# Patient Record
Sex: Male | Born: 1950 | Race: White | Hispanic: No | Marital: Married | Smoking: Former smoker
Health system: Southern US, Community
[De-identification: ages and names within clinical notes are randomized; demographics above are authoritative.]

## PROBLEM LIST (undated history)

## (undated) DIAGNOSIS — I10 Essential (primary) hypertension: Secondary | ICD-10-CM

## (undated) HISTORY — PX: TOE SURGERY: SHX1073

## (undated) HISTORY — PX: TONSILLECTOMY: SUR1361

---

## 2010-08-23 ENCOUNTER — Inpatient Hospital Stay (HOSPITAL_COMMUNITY)
Admission: EM | Admit: 2010-08-23 | Discharge: 2010-08-24 | Payer: Self-pay | Source: Home / Self Care | Attending: Orthopedic Surgery | Admitting: Orthopedic Surgery

## 2010-09-19 NOTE — H&P (Signed)
  [name], [name]                ACCOUNT NO.:  [id number]  MEDICAL RECORD NO.:  [id number]          PATIENT TYPE:  EMS  LOCATION:  MAJO                         FACILITY:  [facility]  PHYSICIAN:  Leonides Grills, M.D.     DATE OF BIRTH:  November 27, 1950  DATE OF ADMISSION:  08/23/2010 DATE OF DISCHARGE:                             HISTORY & PHYSICAL   CHIEF COMPLAINT:  Left third toe injury.  HISTORY OF PRESENT ILLNESS:  [name] is a 60 year old male playing soccer with a dog and kicked a door jam at approximately 6:15 p.m. tonight.  The patient injured his third toe only.  Denies any other injury.  Last meal 11:00 a.m. today.  ALLERGIES:  No known drug allergies.  MEDICATIONS:  Aspirin.  PAST MEDICAL HISTORY:  Healthy.  PAST SURGICAL HISTORY:  Tonsillectomy.  SOCIAL HISTORY:  Drinks 2-6 beers per day.  No drugs, nonsmoker, and married.  REVIEW OF SYSTEMS:  Negative for diabetes mellitus, heart disease. Negative for history of blood clots.  Negative for sleep apnea. Negative for gastric disease or respiratory disease.  PHYSICAL EXAMINATION:  VITAL SIGNS:  Temperature is 98.2, blood pressure 176/86, pulse 105, respiratory rates 20, and O2 saturations 96% on room air. GENERAL:  The patient is a well-developed, well-nourished male in no acute distress. CARDIAC:  Regular rate and rhythm.  No murmurs, rubs, or gallops noted. CHEST:  Lungs clear to auscultation bilaterally.  No wheezing, rhonchi, or rales. ABDOMEN:  Soft and nontender.  Bowel sounds x4 quadrants. NEURO:  Alert and oriented x3.  Sensation intact, L4-S1.  Wiggles toes on command. SKIN:  Open fracture, left third dorsal aspect of the PIP joint.  No rash of his lower extremities. VASCULAR:  Dorsal and pedal pulse 2+ bilaterally. LOWER EXTREMITIES:  Left third toe with open fracture, PIP joint. Otherwise, no gross deformities bilateral feet.  Pain in left foot nontender throughout.  No active bleeding of the left third  toe.  I anticipate that the foot is warm and dry.  RADIOGRAPHS:  Left foot fracture, proximal aspect of the middle phalanx, probable fracture distal aspect of the proximal phalanx, distal interphalangeal joint subluxation.  LABORATORY DATA:  Pending.  ASSESSMENT AND PLAN: 1. The patient is a healthy 60 year old male with open fracture, left     third toe proximal interphalangeal joint. 2. Incision and drainage of left third toe with open reduction and     internal fixation of fracture and possible proximal phalanx head     resection. 3. See Dr. Lestine Box at 5000. 4. Ancef 1 gram IV preop. 5. Alcohol withdrawal protocol.     Richardean Canal, P.A.   ______________________________ Leonides Grills, M.D.    GC/MEDQ  D:  08/23/2010  T:  08/25/2010  Job:  [id number]  Electronically Signed by Richardean Canal P.A. on 09/09/2010 09:03:15 AM Electronically Signed by Leonides Grills M.D. on 09/19/2010 03:43:18 PM

## 2010-11-25 NOTE — Discharge Summary (Signed)
  [name], [name]                ACCOUNT NO.:  [id number]  MEDICAL RECORD NO.:  [id number]          PATIENT TYPE:  INP  LOCATION:  5017                         FACILITY:  [facility]  PHYSICIAN:  Leonides Grills, M.D.     DATE OF BIRTH:  1950/12/02  DATE OF ADMISSION:  08/23/2010 DATE OF DISCHARGE:  08/24/2010                        DISCHARGE SUMMARY - REFERRING   ADMISSION DIAGNOSIS:  Open fracture of left third toe, proximal interphalangeal joint.  DISCHARGE DIAGNOSES: 1. Status post left third toe incision and drainage to bone, open     fracture dislocation. 2. Primary repair of left third toe extensor digitorum longus tendon. 3. Open reduction and internal fixation, left third toe, middle     phalanx, open fracture. 4. Left third toe proximal phalanx head resection.  HISTORY OF PRESENT ILLNESS:  [name], 60 year old male, playing soccer with his dog and accidentally kicked a door jamb at approximately 6:15 on August 23, 2010.  The patient injured his third toe only.  He denies any other injuries.  Last meal was at 11 a.m. today.  The patient seen in the [facility] ER.  CONSULTS:  None.  SURGICAL PROCEDURES:  The patient was taken to the operating room on August 24, 2010, by Dr. Leonides Grills assisted by Richardean Canal, PA-C. The patient underwent general anesthesia and then following procedure was performed. 1. Irrigation and debridement to bone/joint open third toe fracture     dislocation. 2. Primary repair of left third toe extensor digitorum longus. 3. Open reduction and internal fixation of left third toe middle     phalanx open fracture. 4. Left third toe proximal phalanx head resection.  The patient     tolerated procedure well and returned to recovery in good stable     condition.  HOSPITAL COURSE:  The patient seen at 6 a.m. postoperative.  The patient was afebrile and vital signs were stable at that time.  The patient was to work with physical therapy and was  to be discharged home after he received his final IV antibiotics and as he was comfortable and had performed well with therapy.  DISCHARGE INSTRUCTIONS:  ACTIVITY: 1. Heel weightbearing left lower extremity in hard-soled shoes. 2. Wound care.  Keep dressing clean, dry and intact.  SPECIAL INSTRUCTIONS:  Elevate left leg above heart.  FOLLOWUP:  Follow up with Dr. Lestine Box in 1 week.  Call [phone] for appointment.  MEDICATIONS: 1. Percocet 5/325 one to two tablets q.4-6 h. p.r.n. pain. 2. Robaxin 500 mg one tablet q.8 h. p.r.n. spasm.  CONDITION ON DISCHARGE:  The patient was discharged home in good stable condition.     Richardean Canal, P.A.   ______________________________ Leonides Grills, M.D.    GC/MEDQ  D:  09/23/2010  T:  09/23/2010  Job:  [id number]  cc:   Leonides Grills, M.D.  Electronically Signed by Richardean Canal P.A. on 09/24/2010 03:14:15 PM Electronically Signed by Leonides Grills M.D. on 10/04/2010 91:47:82 AM

## 2012-08-24 HISTORY — PX: BACK SURGERY: SHX140

## 2013-04-28 ENCOUNTER — Other Ambulatory Visit: Payer: Self-pay | Admitting: Family Medicine

## 2013-04-28 DIAGNOSIS — M5416 Radiculopathy, lumbar region: Secondary | ICD-10-CM

## 2013-05-09 ENCOUNTER — Ambulatory Visit
Admission: RE | Admit: 2013-05-09 | Discharge: 2013-05-09 | Disposition: A | Discharge status: Home / Self Care | Payer: PRIVATE HEALTH INSURANCE | Source: Ambulatory Visit | Attending: Family Medicine | Admitting: Family Medicine

## 2013-05-09 DIAGNOSIS — M5416 Radiculopathy, lumbar region: Secondary | ICD-10-CM

## 2013-05-22 ENCOUNTER — Other Ambulatory Visit: Payer: Self-pay | Admitting: Neurosurgery

## 2013-05-23 ENCOUNTER — Encounter (HOSPITAL_COMMUNITY)
Admission: RE | Admit: 2013-05-23 | Discharge: 2013-05-23 | Disposition: A | Discharge status: Home / Self Care | Payer: PRIVATE HEALTH INSURANCE | Source: Ambulatory Visit | Attending: Neurosurgery | Admitting: Neurosurgery

## 2013-05-23 ENCOUNTER — Encounter (HOSPITAL_COMMUNITY)
Admission: RE | Admit: 2013-05-23 | Discharge: 2013-05-23 | Disposition: A | Discharge status: Home / Self Care | Payer: PRIVATE HEALTH INSURANCE | Source: Ambulatory Visit | Attending: Anesthesiology | Admitting: Anesthesiology

## 2013-05-23 ENCOUNTER — Encounter (HOSPITAL_COMMUNITY): Payer: Self-pay

## 2013-05-23 HISTORY — DX: Essential (primary) hypertension: I10

## 2013-05-23 LAB — COMPREHENSIVE METABOLIC PANEL
BUN: 7 mg/dL (ref 6–23)
CO2: 25 mEq/L (ref 19–32)
Chloride: 98 mEq/L (ref 96–112)
Creatinine, Ser: 0.68 mg/dL (ref 0.50–1.35)
GFR calc Af Amer: >90 mL/min (ref >90)
GFR calc non Af Amer: >90 mL/min (ref >90)
Total Bilirubin: 0.2 mg/dL — ABNORMAL LOW (ref 0.3–1.2)

## 2013-05-23 LAB — CBC
HCT: 44.5 % (ref 39.0–52.0)
Hemoglobin: 16.2 g/dL (ref 13.0–17.0)
MCH: 34.5 pg — ABNORMAL HIGH (ref 26.0–34.0)
MCHC: 36.4 g/dL — ABNORMAL HIGH (ref 30.0–36.0)
RDW: 12.4 % (ref 11.5–15.5)

## 2013-05-23 LAB — TYPE AND SCREEN
ABO/RH(D): B POS
Antibody Screen: NEGATIVE

## 2013-05-23 LAB — ABO/RH: ABO/RH(D): B POS

## 2013-05-23 MED ORDER — CEFAZOLIN SODIUM-DEXTROSE 2-3 GM-% IV SOLR
2.0000 g | INTRAVENOUS | Status: AC
  Administered 2013-05-24: 2 g via INTRAVENOUS
  Filled 2013-05-23: qty 50

## 2013-05-23 NOTE — Pre-Procedure Instructions (Signed)
RYNELL CIOTTI  05/23/2013   Your procedure is scheduled on: Wednesday, May 24, 2013  Report to [facility] Short Stay (use Main Entrance "A'') at 11:00 AM.  Call this number if you have problems the morning of surgery: [phone]   Remember:   Do not eat food or drink liquids after midnight.   Take these medicines the morning of surgery with A SIP OF WATER: If needed: traMADol (ULTRAM) 50 MG tablet  for pain  Stop taking Aspirin and herbal medications. Do not take any NSAIDs ie: Ibuprofen, Advil, Naproxen or any medication  containing Aspirin.   Do not wear jewelry, make-up or nail polish.  Do not wear lotions, powders, or perfumes. You may wear deodorant.  Do not shave 48 hours prior to surgery. Men may shave face and neck.  Do not bring valuables to the hospital.  [facility] is not responsible for any belongings or valuables.               Contacts, dentures or bridgework may not be worn into surgery.  Leave suitcase in the car. After surgery it may be brought to your room.  For patients admitted to the hospital, discharge time is determined by your treatment team.               Patients discharged the day of surgery will not be allowed to drive home.  Name and phone number of your driver:   Special Instructions: Shower using CHG 2 nights before surgery and the night before surgery.  If you shower the day of surgery use CHG.  Use special wash - you have one bottle of CHG for all showers.  You should use approximately 1/3 of the bottle for each shower.   Please read over the following fact sheets that you were given: Pain Booklet, Coughing and Deep Breathing, Blood Transfusion Information, MRSA Information and Surgical Site Infection Prevention

## 2013-05-24 ENCOUNTER — Inpatient Hospital Stay (HOSPITAL_COMMUNITY): Payer: PRIVATE HEALTH INSURANCE | Admitting: *Deleted

## 2013-05-24 ENCOUNTER — Inpatient Hospital Stay (HOSPITAL_COMMUNITY)
Admission: RE | Admit: 2013-05-24 | Discharge: 2013-05-27 | DRG: 460 | Disposition: A | Discharge status: Home Health | Payer: PRIVATE HEALTH INSURANCE | Source: Ambulatory Visit | Attending: Neurosurgery | Admitting: Neurosurgery

## 2013-05-24 ENCOUNTER — Encounter (HOSPITAL_COMMUNITY)
Admission: RE | Disposition: A | Discharge status: Home Health | Payer: Self-pay | Source: Ambulatory Visit | Attending: Neurosurgery

## 2013-05-24 ENCOUNTER — Inpatient Hospital Stay (HOSPITAL_COMMUNITY): Payer: PRIVATE HEALTH INSURANCE

## 2013-05-24 ENCOUNTER — Encounter (HOSPITAL_COMMUNITY): Payer: Self-pay | Admitting: *Deleted

## 2013-05-24 DIAGNOSIS — M48062 Spinal stenosis, lumbar region with neurogenic claudication: Secondary | ICD-10-CM | POA: Diagnosis present

## 2013-05-24 DIAGNOSIS — Z01818 Encounter for other preprocedural examination: Secondary | ICD-10-CM

## 2013-05-24 DIAGNOSIS — Z01812 Encounter for preprocedural laboratory examination: Secondary | ICD-10-CM

## 2013-05-24 DIAGNOSIS — I10 Essential (primary) hypertension: Secondary | ICD-10-CM | POA: Diagnosis present

## 2013-05-24 DIAGNOSIS — Q762 Congenital spondylolisthesis: Principal | ICD-10-CM

## 2013-05-24 DIAGNOSIS — Z01810 Encounter for preprocedural cardiovascular examination: Secondary | ICD-10-CM

## 2013-05-24 DIAGNOSIS — M4316 Spondylolisthesis, lumbar region: Secondary | ICD-10-CM | POA: Diagnosis present

## 2013-05-24 SURGERY — POSTERIOR LUMBAR FUSION 1 LEVEL
Anesthesia: General | Site: Back | Wound class: Clean

## 2013-05-24 MED ORDER — POTASSIUM CHLORIDE IN NACL 20-0.9 MEQ/L-% IV SOLN
INTRAVENOUS | Status: DC
  Administered 2013-05-24 – 2013-05-25 (×2): via INTRAVENOUS
  Filled 2013-05-24 (×4): qty 1000

## 2013-05-24 MED ORDER — PHENOL 1.4 % MT LIQD
1.0000 | OROMUCOSAL | Status: DC | PRN
  Administered 2013-05-24: 1 via OROMUCOSAL
  Filled 2013-05-24: qty 177

## 2013-05-24 MED ORDER — LIDOCAINE HCL (CARDIAC) 20 MG/ML IV SOLN
INTRAVENOUS | Status: DC | PRN
  Administered 2013-05-24: 70 mg via INTRAVENOUS

## 2013-05-24 MED ORDER — POLYETHYLENE GLYCOL 3350 17 G PO PACK
17.0000 g | PACK | Freq: Every day | ORAL | Status: DC | PRN
  Filled 2013-05-24: qty 1

## 2013-05-24 MED ORDER — HYDROMORPHONE HCL PF 1 MG/ML IJ SOLN
0.5000 mg | INTRAMUSCULAR | Status: DC | PRN
  Administered 2013-05-25 – 2013-05-27 (×5): 1 mg via INTRAVENOUS
  Filled 2013-05-24 (×6): qty 1

## 2013-05-24 MED ORDER — LACTATED RINGERS IV SOLN
INTRAVENOUS | Status: DC
  Administered 2013-05-24: 11:00:00 via INTRAVENOUS

## 2013-05-24 MED ORDER — MENTHOL 3 MG MT LOZG
1.0000 | LOZENGE | OROMUCOSAL | Status: DC | PRN
  Filled 2013-05-24: qty 9

## 2013-05-24 MED ORDER — HYDROMORPHONE HCL PF 1 MG/ML IJ SOLN: 0.2500 mg | INTRAMUSCULAR | Status: DC | PRN

## 2013-05-24 MED ORDER — ONDANSETRON HCL 4 MG/2ML IJ SOLN: 4.0000 mg | Freq: Four times a day (QID) | INTRAMUSCULAR | Status: DC | PRN

## 2013-05-24 MED ORDER — SUFENTANIL CITRATE 50 MCG/ML IV SOLN
INTRAVENOUS | Status: DC | PRN
  Administered 2013-05-24 (×5): 10 ug via INTRAVENOUS

## 2013-05-24 MED ORDER — OXYCODONE HCL 5 MG/5ML PO SOLN: 5.0000 mg | Freq: Once | ORAL | Status: DC | PRN

## 2013-05-24 MED ORDER — PROPOFOL 10 MG/ML IV BOLUS
INTRAVENOUS | Status: DC | PRN
  Administered 2013-05-24: 200 mg via INTRAVENOUS

## 2013-05-24 MED ORDER — SODIUM CHLORIDE 0.9 % IJ SOLN: 3.0000 mL | INTRAMUSCULAR | Status: DC | PRN

## 2013-05-24 MED ORDER — ONDANSETRON HCL 4 MG/2ML IJ SOLN
INTRAMUSCULAR | Status: DC | PRN
  Administered 2013-05-24: 4 mg via INTRAVENOUS

## 2013-05-24 MED ORDER — CEFAZOLIN SODIUM 1-5 GM-% IV SOLN
1.0000 g | Freq: Three times a day (TID) | INTRAVENOUS | Status: AC
  Administered 2013-05-24 – 2013-05-25 (×2): 1 g via INTRAVENOUS
  Filled 2013-05-24 (×2): qty 50

## 2013-05-24 MED ORDER — LISINOPRIL 20 MG PO TABS
20.0000 mg | ORAL_TABLET | Freq: Every day | ORAL | Status: DC
  Administered 2013-05-24 – 2013-05-27 (×4): 20 mg via ORAL
  Filled 2013-05-24 (×4): qty 1

## 2013-05-24 MED ORDER — NEOSTIGMINE METHYLSULFATE 1 MG/ML IJ SOLN
INTRAMUSCULAR | Status: DC | PRN
  Administered 2013-05-24: 5 mg via INTRAVENOUS

## 2013-05-24 MED ORDER — ROCURONIUM BROMIDE 100 MG/10ML IV SOLN
INTRAVENOUS | Status: DC | PRN
  Administered 2013-05-24: 50 mg via INTRAVENOUS

## 2013-05-24 MED ORDER — ASPIRIN EC 81 MG PO TBEC
81.0000 mg | DELAYED_RELEASE_TABLET | Freq: Every day | ORAL | Status: DC
  Administered 2013-05-24 – 2013-05-27 (×4): 81 mg via ORAL
  Filled 2013-05-24 (×5): qty 1

## 2013-05-24 MED ORDER — ALUM & MAG HYDROXIDE-SIMETH 200-200-20 MG/5ML PO SUSP: 30.0000 mL | Freq: Four times a day (QID) | ORAL | Status: DC | PRN

## 2013-05-24 MED ORDER — GLYCOPYRROLATE 0.2 MG/ML IJ SOLN
INTRAMUSCULAR | Status: DC | PRN
  Administered 2013-05-24: 0.6 mg via INTRAVENOUS

## 2013-05-24 MED ORDER — LIDOCAINE-EPINEPHRINE 0.5 %-1:200000 IJ SOLN
INTRAMUSCULAR | Status: DC | PRN
  Administered 2013-05-24: 10 mL

## 2013-05-24 MED ORDER — 0.9 % SODIUM CHLORIDE (POUR BTL) OPTIME
TOPICAL | Status: DC | PRN
  Administered 2013-05-24: 1000 mL

## 2013-05-24 MED ORDER — DIAZEPAM 5 MG PO TABS
5.0000 mg | ORAL_TABLET | Freq: Four times a day (QID) | ORAL | Status: DC | PRN
  Administered 2013-05-24 – 2013-05-25 (×3): 5 mg via ORAL
  Filled 2013-05-24 (×3): qty 1

## 2013-05-24 MED ORDER — SODIUM CHLORIDE 0.9 % IJ SOLN
3.0000 mL | Freq: Two times a day (BID) | INTRAMUSCULAR | Status: DC
  Administered 2013-05-24 – 2013-05-27 (×5): 3 mL via INTRAVENOUS

## 2013-05-24 MED ORDER — HYDROCODONE-ACETAMINOPHEN 5-325 MG PO TABS
1.0000 | ORAL_TABLET | ORAL | Status: DC | PRN
  Administered 2013-05-26: 2 via ORAL
  Filled 2013-05-24: qty 2

## 2013-05-24 MED ORDER — OXYCODONE-ACETAMINOPHEN 5-325 MG PO TABS
1.0000 | ORAL_TABLET | ORAL | Status: DC | PRN
  Administered 2013-05-25 – 2013-05-27 (×6): 2 via ORAL
  Filled 2013-05-24 (×8): qty 2

## 2013-05-24 MED ORDER — PHENYLEPHRINE HCL 10 MG/ML IJ SOLN
INTRAMUSCULAR | Status: DC | PRN
  Administered 2013-05-24 (×3): 80 ug via INTRAVENOUS

## 2013-05-24 MED ORDER — ACETAMINOPHEN 325 MG PO TABS: 650.0000 mg | ORAL_TABLET | ORAL | Status: DC | PRN

## 2013-05-24 MED ORDER — MIDAZOLAM HCL 5 MG/5ML IJ SOLN
INTRAMUSCULAR | Status: DC | PRN
  Administered 2013-05-24: 2 mg via INTRAVENOUS

## 2013-05-24 MED ORDER — SENNA 8.6 MG PO TABS
1.0000 | ORAL_TABLET | Freq: Two times a day (BID) | ORAL | Status: DC
  Administered 2013-05-24 – 2013-05-27 (×6): 8.6 mg via ORAL
  Filled 2013-05-24 (×8): qty 1

## 2013-05-24 MED ORDER — OXYCODONE HCL 5 MG PO TABS: 5.0000 mg | ORAL_TABLET | Freq: Once | ORAL | Status: DC | PRN

## 2013-05-24 MED ORDER — ACETAMINOPHEN 650 MG RE SUPP: 650.0000 mg | RECTAL | Status: DC | PRN

## 2013-05-24 MED ORDER — LACTATED RINGERS IV SOLN
INTRAVENOUS | Status: DC | PRN
  Administered 2013-05-24 (×3): via INTRAVENOUS

## 2013-05-24 MED ORDER — ONDANSETRON HCL 4 MG/2ML IJ SOLN: 4.0000 mg | INTRAMUSCULAR | Status: DC | PRN

## 2013-05-24 MED ORDER — THROMBIN 20000 UNITS EX SOLR
CUTANEOUS | Status: DC | PRN
  Administered 2013-05-24: 17:00:00 via TOPICAL

## 2013-05-24 MED ORDER — SODIUM CHLORIDE 0.9 % IV SOLN: 250.0000 mL | INTRAVENOUS | Status: DC

## 2013-05-24 SURGICAL SUPPLY — 66 items
BENZOIN TINCTURE PRP APPL 2/3 (GAUZE/BANDAGES/DRESSINGS) IMPLANT
BLADE SURG ROTATE 9660 (MISCELLANEOUS) ×2 IMPLANT
BONE MATRIX OSTEOCEL PRO MED (Bone Implant) ×2 IMPLANT
BONE MATRIX OSTEOCEL PRO SM (Bone Implant) ×2 IMPLANT
BUR MATCHSTICK NEURO 3.0 LAGG (BURR) ×2 IMPLANT
CAGE COROENT MP 11X23X9 (Cage) ×4 IMPLANT
CANISTER SUCTION 2500CC (MISCELLANEOUS) ×2 IMPLANT
CLOTH BEACON ORANGE TIMEOUT ST (SAFETY) ×2 IMPLANT
CONT SPEC 4OZ CLIKSEAL STRL BL (MISCELLANEOUS) ×2 IMPLANT
COVER BACK TABLE 24X17X13 BIG (DRAPES) IMPLANT
DECANTER SPIKE VIAL GLASS SM (MISCELLANEOUS) ×2 IMPLANT
DERMABOND ADVANCED (GAUZE/BANDAGES/DRESSINGS) ×1
DERMABOND ADVANCED .7 DNX12 (GAUZE/BANDAGES/DRESSINGS) ×1 IMPLANT
DRAPE C-ARM 42X72 X-RAY (DRAPES) ×6 IMPLANT
DRAPE C-ARMOR (DRAPES) ×2 IMPLANT
DRAPE LAPAROTOMY 100X72X124 (DRAPES) ×2 IMPLANT
DRAPE POUCH INSTRU U-SHP 10X18 (DRAPES) ×2 IMPLANT
DRAPE SURG 17X23 STRL (DRAPES) ×2 IMPLANT
DRESSING TELFA 8X3 (GAUZE/BANDAGES/DRESSINGS) IMPLANT
DRSG OPSITE POSTOP 4X6 (GAUZE/BANDAGES/DRESSINGS) ×2 IMPLANT
DURAPREP 26ML APPLICATOR (WOUND CARE) ×2 IMPLANT
ELECT REM PT RETURN 9FT ADLT (ELECTROSURGICAL) ×2
ELECTRODE REM PT RTRN 9FT ADLT (ELECTROSURGICAL) ×1 IMPLANT
GAUZE SPONGE 4X4 16PLY XRAY LF (GAUZE/BANDAGES/DRESSINGS) IMPLANT
GLOVE BIO SURGEON STRL SZ 6.5 (GLOVE) ×10 IMPLANT
GLOVE BIOGEL PI IND STRL 6.5 (GLOVE) ×2 IMPLANT
GLOVE BIOGEL PI INDICATOR 6.5 (GLOVE) ×2
GLOVE ECLIPSE 6.5 STRL STRAW (GLOVE) ×4 IMPLANT
GLOVE ECLIPSE 7.5 STRL STRAW (GLOVE) ×4 IMPLANT
GLOVE EXAM NITRILE LRG STRL (GLOVE) IMPLANT
GLOVE EXAM NITRILE MD LF STRL (GLOVE) IMPLANT
GLOVE EXAM NITRILE XL STR (GLOVE) IMPLANT
GLOVE EXAM NITRILE XS STR PU (GLOVE) IMPLANT
GLOVE INDICATOR 7.5 STRL GRN (GLOVE) ×2 IMPLANT
GOWN BRE IMP SLV AUR LG STRL (GOWN DISPOSABLE) ×8 IMPLANT
GOWN BRE IMP SLV AUR XL STRL (GOWN DISPOSABLE) ×2 IMPLANT
GOWN STRL REIN 2XL LVL4 (GOWN DISPOSABLE) IMPLANT
KIT BASIN OR (CUSTOM PROCEDURE TRAY) ×2 IMPLANT
KIT POSITION SURG JACKSON T1 (MISCELLANEOUS) ×2 IMPLANT
KIT ROOM TURNOVER OR (KITS) ×2 IMPLANT
NEEDLE HYPO 25X1 1.5 SAFETY (NEEDLE) ×2 IMPLANT
NEEDLE SPNL 18GX3.5 QUINCKE PK (NEEDLE) ×2 IMPLANT
NS IRRIG 1000ML POUR BTL (IV SOLUTION) ×2 IMPLANT
PACK LAMINECTOMY NEURO (CUSTOM PROCEDURE TRAY) ×2 IMPLANT
PAD ARMBOARD 7.5X6 YLW CONV (MISCELLANEOUS) ×4 IMPLANT
ROD 35MM (Rod) ×4 IMPLANT
SCREW LOCK (Screw) ×4 IMPLANT
SCREW LOCK FXNS SPNE MAS PL (Screw) ×4 IMPLANT
SCREW SHANK 5.0X30MM (Screw) ×4 IMPLANT
SCREW SHANK PLIF 5.0X25 LUMBAR (Screw) ×4 IMPLANT
SCREW TULIP 5.5 (Screw) ×8 IMPLANT
SPONGE GAUZE 4X4 12PLY (GAUZE/BANDAGES/DRESSINGS) IMPLANT
SPONGE LAP 4X18 X RAY DECT (DISPOSABLE) IMPLANT
SPONGE SURGIFOAM ABS GEL 100 (HEMOSTASIS) ×2 IMPLANT
STRIP CLOSURE SKIN 1/2X4 (GAUZE/BANDAGES/DRESSINGS) IMPLANT
SUT PROLENE 6 0 BV (SUTURE) IMPLANT
SUT VIC AB 0 CT1 18XCR BRD8 (SUTURE) ×1 IMPLANT
SUT VIC AB 0 CT1 8-18 (SUTURE) ×1
SUT VIC AB 2-0 CT1 18 (SUTURE) ×4 IMPLANT
SUT VIC AB 3-0 SH 8-18 (SUTURE) ×4 IMPLANT
SYR 20ML ECCENTRIC (SYRINGE) ×2 IMPLANT
TOWEL OR 17X24 6PK STRL BLUE (TOWEL DISPOSABLE) ×2 IMPLANT
TOWEL OR 17X26 10 PK STRL BLUE (TOWEL DISPOSABLE) ×2 IMPLANT
TRAY FOLEY CATH 14FRSI W/METER (CATHETERS) IMPLANT
TRAY FOLEY CATH 16FRSI W/METER (SET/KITS/TRAYS/PACK) ×2 IMPLANT
WATER STERILE IRR 1000ML POUR (IV SOLUTION) ×2 IMPLANT

## 2013-05-24 NOTE — Anesthesia Procedure Notes (Signed)
Procedure Name: Intubation Date/Time: 05/24/2013 4:33 PM Performed by: [name] Pre-anesthesia Checklist: Patient identified, Emergency Drugs available, Suction available, Patient being monitored and Timeout performed Patient Re-evaluated:Patient Re-evaluated prior to inductionOxygen Delivery Method: Circle system utilized Preoxygenation: Pre-oxygenation with 100% oxygen Intubation Type: IV induction Ventilation: Mask ventilation without difficulty and Oral airway inserted - appropriate to patient size Laryngoscope Size: Miller and 2 Grade View: Grade I Tube type: Oral Tube size: 7.5 mm Number of attempts: 1 Airway Equipment and Method: Stylet Placement Confirmation: ETT inserted through vocal cords under direct vision,  positive ETCO2 and breath sounds checked- equal and bilateral Secured at: 22 cm Tube secured with: Tape Dental Injury: Teeth and Oropharynx as per pre-operative assessment

## 2013-05-24 NOTE — Anesthesia Postprocedure Evaluation (Signed)
  Anesthesia Post-op Note  Patient: [name]  Procedure(s) Performed: Procedure(s) with comments: Lumbar Four-Five Posterior lumbar interbody fusion with interbody prosthesis posterior lateral arthrodesis and posterior nonsegmental instrumentation (N/A) - Lumbar Four-Five posterior lumbar interbody fusion with interbody prosthesis posterior lateral arthrodesis and posterior nonsegmental instrumentation  Patient Location: PACU  Anesthesia Type:General  Level of Consciousness: awake  Airway and Oxygen Therapy: Patient Spontanous Breathing  Post-op Pain: mild  Post-op Assessment: Post-op Vital signs reviewed  Post-op Vital Signs: Reviewed  Complications: No apparent anesthesia complications 

## 2013-05-24 NOTE — Preoperative (Signed)
Beta Blockers   Reason not to administer Beta Blockers:Not Applicable 

## 2013-05-24 NOTE — Plan of Care (Signed)
Problem: Consults Goal: Diagnosis - Spinal Surgery Outcome: Progressing Thoraco/Lumbar Spine Fusion     

## 2013-05-24 NOTE — H&P (Signed)
BP 169/102  Pulse 98  Temp(Src) 98.1 F (36.7 C) (Oral)  Resp 20  SpO2 97% HISTORY OF PRESENT ILLNESS:                     [name] comes in today for evaluation of severe pain that he has in his lower extremities whenever he stands or walks.  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  Simply because it hurts too much.  He is 62 years of age, left-handed.   He is a retired Geophysicist/field seismologist for [name].   His pain chart, he lists pain posteriorly in his legs, not much in the way of back pain, nothing but sitting will help to relieve the pain.   REVIEW OF SYSTEMS:                                    Positive for hypertension, leg pain with walking, back pain, and leg pain.  He denies allergic, hematologic, endocrine, psychiatric, neurologic, skin, genitourinary, gastrointestinal, respiratory, ears, nose, throat, mouth, and eye or constitutional problems.   PAST MEDICAL HISTORY:            Current Medical Conditions:  Included hypertension.            Medications and Allergies:  No known drug allergies.  He takes lisinopril, Bayer aspirin 81 mg a day, tramadol, and ibuprofen p.r.n.   FAMILY HISTORY:                                            Positive for brain tumor and coronary artery disease, myocardial infarction.  He sustained a compound fracture of his toe in December 2013.   SOCIAL HISTORY:                                            He does smoke.  He does drink alcohol.  He does not use illicit drugs.   PHYSICAL EXAMINATION:                                On examination, he is alert and oriented x4 and answers all questions appropriately.  He is in obvious distress.  He can toe walk and heel walk.  Normal muscle tone, bulk, and coordination.  5/5 strength on manual exam in the lower extremities and slight weakness in his right grip.  Otherwise, normal  strength in the upper extremities.  2+ reflexes, biceps, triceps, brachioradialis, knees, and ankles.  Positive Tinel sign both carpal tunnels, left greater than right.  Romberg is negative.  He can toe walk.  He can heel walk.  Pupils equal, round and reactive to light.  Full extraocular movements.  Full visual fields.  Hearing intact to finger rub bilaterally.  Uvula elevates midline.  Shoulder shrug is normal.  Tongue protrudes in the midline.  Downgoing toes to plantar stimulation.  No clonus.  Mild Hoffmann's bilaterally.   IMAGING STUDIES:  MRI of the lumbar spine shows severe stenosis at L4-5 facet arthropathy.  Conus and cauda are normal.  He does have foraminal narrowing bilaterally at L5-S1 worse on sagittal than on the axial.  Cauda is normal.  Conus medullaris is normal.  No abnormalities within the vertebral bodies.   DIAGNOSIS:                                                     Spondylolisthesis approximately 3 mm of L4 on L5.  He has retrolisthesis of L5 on S1 about 3 mm.  The stenosis at L4-5 is clearly the problem in his case.  He has anatomic problems at L5-S1, but that is not causing the pain that he has now posterior in both legs whenever he stands or walks.  He has classic neurogenic claudication and I think this can be improved with lumbar decompression and subsequent arthrodesis.  I gave him the reasons as to why we would leave L5-S1 out and that being simply because it looks bad on MRI, does not mean he is going to be better because I operated and extended his fusion the whole another level.  I do think that he will have a great deal of pain afterwards, but will improve slowly but surely over the next few months.  His return of work will be guided by his functionality, but there is nothing inherent that puts his back and they need to take a risk.  We scheduled his operation for next week Wednesday.  I gave he and his wife the detailed instruction  sheet with regards to the operation.  I believe I answered all of their questions in the office today.  The risks, benefits, bleeding, infection, no relief, fusion failure, hardware failure, damage to the nerve roots, bowel or bladder dysfunction were discussed.  He and his family wishes to proceed.

## 2013-05-24 NOTE — Transfer of Care (Signed)
Immediate Anesthesia Transfer of Care Note  Patient: [name]  Procedure(s) Performed: Procedure(s) with comments: Lumbar Four-Five Posterior lumbar interbody fusion with interbody prosthesis posterior lateral arthrodesis and posterior nonsegmental instrumentation (N/A) - Lumbar Four-Five posterior lumbar interbody fusion with interbody prosthesis posterior lateral arthrodesis and posterior nonsegmental instrumentation  Patient Location: PACU  Anesthesia Type:General  Level of Consciousness: awake  Airway & Oxygen Therapy: Patient Spontanous Breathing and Patient connected to face mask oxygen  Post-op Assessment: Report given to PACU RN and Post -op Vital signs reviewed and stable  Post vital signs: Reviewed and stable  Complications: No apparent anesthesia complications

## 2013-05-24 NOTE — Anesthesia Preprocedure Evaluation (Addendum)
Anesthesia Evaluation  Patient identified by MRN, date of birth, ID band Patient awake    Reviewed: Allergy & Precautions, H&P , NPO status , Patient's Chart, lab work & pertinent test results  Airway Mallampati: II TM Distance: >3 FB Neck ROM: full    Dental  (+) Partial Upper, Partial Lower and Dental Advisory Given   Pulmonary Current Smoker,          Cardiovascular hypertension, Pt. on medications     Neuro/Psych negative psych ROS   GI/Hepatic negative GI ROS, Neg liver ROS,   Endo/Other  negative endocrine ROS  Renal/GU negative Renal ROS     Musculoskeletal   Abdominal   Peds  Hematology   Anesthesia Other Findings   Reproductive/Obstetrics                          Anesthesia Physical Anesthesia Plan  ASA: II  Anesthesia Plan: General   Post-op Pain Management:    Induction: Intravenous  Airway Management Planned: Oral ETT  Additional Equipment:   Intra-op Plan:   Post-operative Plan: Extubation in OR  Informed Consent: I have reviewed the patients History and Physical, chart, labs and discussed the procedure including the risks, benefits and alternatives for the proposed anesthesia with the patient or authorized representative who has indicated his/her understanding and acceptance.   Dental advisory given  Plan Discussed with: CRNA, Anesthesiologist and Surgeon  Anesthesia Plan Comments:        Anesthesia Quick Evaluation

## 2013-05-24 NOTE — Op Note (Signed)
05/24/2013  8:00 PM  PATIENT:  [name]  62 y.o. male  PRE-OPERATIVE DIAGNOSIS:  spondylolisthesis lumbar stenosis, L4/5, neurogenic claudication  POST-OPERATIVE DIAGNOSIS:  spondylolisthesis lumbar stenosis L4/5, neurogenic claudication  PROCEDURE:  Procedure(s): Lumbar Four-Five Posterior lumbar interbody fusion with interbody peek nuvasive 41mmx11mm prosthesis Lumbar decompression L4/5 beyond what was necessary for a PLIF  posterior lateral arthrodesis morselized auto and allograft L4/5  posterior nonsegmental instrumentation Nuvasive medial to lateral trajectory pedicle screws   SURGEON:  Surgeon(s): Carmela Hurt, MD  ASSISTANTS:none  ANESTHESIA:   general  EBL:  Total I/O In: -  Out: 50 [Urine:50]  BLOOD ADMINISTERED:none  CELL SAVER GIVEN:none  COUNT:per nursing  DRAINS: none   SPECIMEN:  No Specimen  DICTATION: QUANTRELL SPLITT is a 62 y.o. male whom was taken to the operating room intubated, and placed under a general anesthetic without difficulty. A foley catheter was placed under sterile conditions. He  was positioned prone on a Jackson stable with all pressure points properly padded.  The  lumbar region was prepped and draped in a sterile manner. I infiltrated 20cc's 1/2%lidocaine/1:2000,000 strength epinephrine into the planned incision. I opened the skin with a 10 blade and took the incision down to the thoracolumbar fascia. I exposed the lamina of L4, and L5 in a subperiosteal fashion bilaterally. I confirmed my location with an intraoperative xray.  I placed self retaining retractors and started the decompression.  I decompressed the L4 and L5 nerve roots beyond what was necessary for a PLIF by performing complete inferior facetectomies of L4 bilaterally and a complete laminectomies of L4 bilaterally, and a hemilaminectomy of L5. I fully decompressed the nerve roots of L4 and L5 by following them out into the lateral recesses and foraminae bilaterally. I used  the Kerrison punches and the drill to decompress the canal and nerve roots.  I performed discetomies of L4/5 bilaterally and prepared the disc spaces for the cages. I used disc space shavers, a rasp, the Kerrison punches, along with curettes of various sizes and pituitary rongeurs. I measured the disc spaces and chose 11mm x38mm cages. I first placed morselized auto and allograft in the anterior disc space, then the cages, one on each side without difficulty. Fluoroscopy showed the cages to be in good position bilaterally. The cages were packed with morselized auto and allograft. I placed the pedicle screws with fluoroscopic guidance. Each screw was placed in the same manner. On a Anterior-Posterior view I notched my entry site with the drill, at each pedicle. Each screw was then placed with lateral fluoroscopic guidance in the cortical bone in a medial to lateral trajectory through the pedicle. I used the drill to start the screw hole, then drilled to the final length. I then tapped each hole and finally placed the screw. All screw holes were inspected for cutouts. I appreciated no cutouts in any of the pedicles. I then took final views in the ap and lateral views. All the screws were in good position. I decorticated the lateral bony margins comprising L4 and L5 and completed the arthrodesis with morselized auto and allograft(osteocell plus).  I finished the construct by placing the screw heads , the rods, and the locking caps into position. I irrigated the wound then closed in a layered fashion.  I approximated the thoracolumbar fascia, subcutaneous, and subcuticular planes with vicryl sutures. I applied Dermabond for a sterile dressing.  I decompressed the spinal canal   PLAN OF CARE: Admit to inpatient  PATIENT DISPOSITION:  PACU - hemodynamically stable.   Delay start of Pharmacological VTE agent (>24hrs) due to surgical blood loss or risk of bleeding:  yes

## 2013-05-25 NOTE — Progress Notes (Signed)
Patient ID: [name], male   DOB: 24-Feb-1951, 62 y.o.   MRN: [id number] BP 144/88  Pulse 70  Temp(Src) 98.9 F (37.2 C) (Oral)  Resp 20  Ht 5\' 7"  (1.702 m)  Wt 69.355 kg (152 lb 14.4 oz)  BMI 23.94 kg/m2  SpO2 96% Alert and oriented x 4 Speech is clear and fluent Moving lower extremities well Wound is clean, dry Saline lock iv, continue pt

## 2013-05-25 NOTE — Progress Notes (Signed)
PT Cancellation Note  Patient Details Name: AVEER BARTOW MRN: [id number] DOB: 04-19-1951   Cancelled Treatment:    Reason Eval/Treat Not Completed: Other (comment) (pt just getting comfortable to try to sleep).  Pt is requesting PT come back around 1600.  PT to check back then as time allows.     Rollene Rotunda Naleigha Raimondi, PT, DPT [phone]   05/25/2013, 1:36 PM

## 2013-05-25 NOTE — Progress Notes (Signed)
UR COMPLETED  

## 2013-05-25 NOTE — Anesthesia Postprocedure Evaluation (Signed)
  Anesthesia Post-op Note  Patient: [name]  Procedure(s) Performed: Procedure(s) with comments: Lumbar Four-Five Posterior lumbar interbody fusion with interbody prosthesis posterior lateral arthrodesis and posterior nonsegmental instrumentation (N/A) - Lumbar Four-Five posterior lumbar interbody fusion with interbody prosthesis posterior lateral arthrodesis and posterior nonsegmental instrumentation  Patient Location: PACU  Anesthesia Type:General  Level of Consciousness: awake  Airway and Oxygen Therapy: Patient Spontanous Breathing  Post-op Pain: mild  Post-op Assessment: Post-op Vital signs reviewed  Post-op Vital Signs: Reviewed  Complications: No apparent anesthesia complications

## 2013-05-25 NOTE — Evaluation (Signed)
Physical Therapy Evaluation Patient Details Name: [name] MRN: [id number] DOB: [birth year] Today's Date: 05/25/2013 Time: 4098-1191 PT Time Calculation (min): 28 min  PT Assessment / Plan / Recommendation History of Present Illness  62 y.o. male admitted to [facility] on 05/24/13 for elective L4/5 PLIF.    Clinical Impression  Pt is POD #1 s/p lumbar spine surgery.  He is moving very slowly and guarded.  He is very limited by pain and weakness in bil legs.  He lives in a two story home and his bedroom is upstairs.  He would benefit from HHPT f/u as he is moving slower than expected.      PT Assessment  Patient needs continued PT services    Follow Up Recommendations  Home health PT;Supervision for mobility/OOB    Does the patient have the potential to tolerate intense rehabilitation    Not at this time  Barriers to Discharge Other (comment) (None) None    Equipment Recommendations  None recommended by PT    Recommendations for Other Services Other (comment) (None)   Frequency Min 5X/week    Precautions / Restrictions Precautions Precautions: Back Precaution Comments: reviewed log roll Required Braces or Orthoses:  (None)   Pertinent Vitals/Pain See vitals flow sheet.      Mobility  Bed Mobility Bed Mobility: Rolling Right;Right Sidelying to Sit;Sitting - Scoot to Edge of Bed;Sit to Sidelying Right;Scooting to [facility] Rolling Right: 5: Supervision;With rail Right Sidelying to Sit: 5: Supervision;With rails Sitting - Scoot to Edge of Bed: 5: Supervision;With rail Sit to Sidelying Right: 5: Supervision;With rail;HOB elevated Scooting to [facility]: 5: Supervision;With rail (bed in trendelenberg) Details for Bed Mobility Assistance: supervision for safety, cues for log roll and correct technique.  Pt requiring extra time due to pain.   Transfers Transfers: Sit to Stand;Stand to Sit Sit to Stand: 4: Min assist;With upper extremity assist;With armrests;From bed;From toilet Stand to  Sit: 4: Min assist;With upper extremity assist;With armrests;To bed;To toilet Details for Transfer Assistance: min assist to support trunk over weak and painful legs.  Verbal cues for safest hand placement during transitions.   Ambulation/Gait Ambulation/Gait Assistance: 4: Min assist Ambulation Distance (Feet): 25 Feet Assistive device: Rolling walker Ambulation/Gait Assistance Details: min assist to support trunk over weak legs for safety and balance.  Verbal cues for upright posture and extended knees.   Gait Pattern: Step-through pattern;Right flexed knee in stance;Left flexed knee in stance;Trunk flexed Gait velocity: significantly decreased General Gait Details: cues to breathe while walking.         PT Diagnosis: Difficulty walking;Abnormality of gait;Generalized weakness;Acute pain  PT Problem List: Decreased strength;Decreased activity tolerance;Decreased balance;Decreased mobility;Decreased knowledge of use of DME;Decreased knowledge of precautions;Pain PT Treatment Interventions: DME instruction;Gait training;Stair training;Functional mobility training;Therapeutic activities;Therapeutic exercise;Balance training;Neuromuscular re-education;Patient/family education;Modalities     PT Goals(Current goals can be found in the care plan section) Acute Rehab PT Goals Patient Stated Goal: to return home PT Goal Formulation: With patient Time For Goal Achievement: 06/01/13 Potential to Achieve Goals: Good  Visit Information  Last PT Received On: 05/25/13 Assistance Needed: +1 Reason Eval/Treat Not Completed: Other (comment) (pt just getting comfortable to try to sleep) History of Present Illness: 62 y.o. male admitted to [facility] on 05/24/13 for elective L4/5 PLIF.         Prior Functioning  Home Living Family/patient expects to be discharged to:: Private residence Living Arrangements: Spouse/significant other Available Help at Discharge: Available PRN/intermittently Type of Home:  House Home Access: Stairs to enter  Entrance Stairs-Number of Steps: 5 Entrance Stairs-Rails: Right;Left;Can reach both Home Layout: Two level;1/2 bath on main level Alternate Level Stairs-Number of Steps: flight Alternate Level Stairs-Rails: Right Home Equipment: Crutches;Walker - 2 wheels Prior Function Level of Independence: Independent Comments: pt reports his gait was limited to about 20 yards as it was getting very bad PTA.   Communication Communication: No difficulties Dominant Hand: Left    Cognition  Cognition Arousal/Alertness: Awake/alert Behavior During Therapy: WFL for tasks assessed/performed Overall Cognitive Status: Within Functional Limits for tasks assessed    Extremity/Trunk Assessment Upper Extremity Assessment Upper Extremity Assessment: Defer to OT evaluation Lower Extremity Assessment Lower Extremity Assessment: Generalized weakness Cervical / Trunk Assessment Cervical / Trunk Assessment: Kyphotic   Balance Balance Balance Assessed: Yes Static Sitting Balance Static Sitting - Balance Support: Bilateral upper extremity supported;Feet supported Static Sitting - Level of Assistance: 5: Stand by assistance Static Standing Balance Static Standing - Balance Support: Bilateral upper extremity supported Static Standing - Level of Assistance: 4: Min assist Dynamic Standing Balance Dynamic Standing - Balance Support: Bilateral upper extremity supported Dynamic Standing - Level of Assistance: 4: Min assist  End of Session PT - End of Session Activity Tolerance: Patient limited by fatigue;Patient limited by pain Patient left: in bed;with call bell/phone within reach Nurse Communication: Mobility status    Lurena Joiner B. Tylar Merendino, PT, DPT [phone]   05/25/2013, 4:53 PM

## 2013-05-25 NOTE — Evaluation (Signed)
Occupational Therapy Evaluation Patient Details Name: [name] MRN: [id number] DOB: 1951/02/24 Today's Date: 05/25/2013 Time: 4098-1191 OT Time Calculation (min): 18 min  OT Assessment / Plan / Recommendation History of present illness 62 yo male s/p PLIF L4-5   Clinical Impression   Patient is s/p PLIFL4-5  surgery resulting in functional limitations due to the deficits listed below (see OT problem list).  Patient will benefit from skilled OT acutely to increase independence and safety with ADLS to allow discharge HHOT. Pt demonstrates BIL LE weakness limiting mobility and pt may benefit from DME to allow sitting for adls     OT Assessment  Patient needs continued OT Services    Follow Up Recommendations  Home health OT;Supervision - Intermittent    Barriers to Discharge      Equipment Recommendations  None recommended by OT    Recommendations for Other Services    Frequency  Min 2X/week    Precautions / Restrictions Precautions Precautions: Back Precaution Comments: handout provided and reviewed   Pertinent Vitals/Pain 8 out 10 pain Pt with prolong sitting  Pt encouraged to include rest breaks supine in bed during the day and OOB for meals/ therapy    ADL  Eating/Feeding: Modified independent Where Assessed - Eating/Feeding: Chair Grooming: Wash/dry hands;Min guard Where Assessed - Grooming: Unsupported standing Lower Body Dressing: Maximal assistance Where Assessed - Lower Body Dressing: Unsupported sit to stand (unable to cross bil LE) Toilet Transfer: Min Pension scheme manager Method: Sit to Barista: Regular height toilet;Grab bars Toileting - Architect and Hygiene: Min guard Where Assessed - Engineer, mining and Hygiene: Sit to stand from 3-in-1 or toilet Equipment Used: Gait belt;Rolling walker Transfers/Ambulation Related to ADLs: pt reports prior to admission with minimal ambulation due to pain and BIL  LE fatigue. pt provided RW to allow UE to assist with BIL LE weakness. Pt with slight knee flexion with static standing to wash hands. Pt reports fatigue in legs after bathroom transfer ADL Comments: Pt reports longest distance walked was the day of preop due to miss directions by staff to location of preop . pt reports ambulation for 25 minutes that day and extreme fatigue. Pt educated on back precautions and provided hand. Pt following along with handout during OT demonstrations. pt will have wife (A) prn durnig the day however wife will be a work majority of the day.    OT Diagnosis: Generalized weakness;Acute pain  OT Problem List: Decreased strength;Decreased activity tolerance;Impaired balance (sitting and/or standing);Decreased safety awareness;Decreased knowledge of use of DME or AE;Decreased knowledge of precautions;Pain OT Treatment Interventions: Self-care/ADL training;DME and/or AE instruction;Therapeutic activities;Patient/family education;Balance training   OT Goals(Current goals can be found in the care plan section) Acute Rehab OT Goals Patient Stated Goal: to return home OT Goal Formulation: With patient Time For Goal Achievement: 06/08/13 Potential to Achieve Goals: Good ADL Goals Pt Will Perform Grooming: with modified independence;standing Pt Will Perform Upper Body Bathing: with modified independence;standing Pt Will Perform Lower Body Bathing: with modified independence;sit to/from stand;with adaptive equipment Pt Will Transfer to Toilet: with modified independence;regular height toilet Pt Will Perform Tub/Shower Transfer: Tub transfer;with supervision  Visit Information  Last OT Received On: 05/25/13 Assistance Needed: +1 History of Present Illness: 62 yo male s/p PLIF L4-5       Prior Functioning     Home Living Family/patient expects to be discharged to:: Private residence Living Arrangements: Spouse/significant other Available Help at Discharge: Available  PRN/intermittently Type of  Home: House Home Access: Stairs to enter Secretary/administrator of Steps: 5 Home Layout: Two level;1/2 bath on main level Home Equipment: Crutches;Walker - 2 wheels Prior Function Level of Independence: Independent Dominant Hand: Left         Vision/Perception Vision - History Baseline Vision: Wears glasses all the time Patient Visual Report: No change from baseline   Cognition  Cognition Arousal/Alertness: Awake/alert Behavior During Therapy: WFL for tasks assessed/performed Overall Cognitive Status: Within Functional Limits for tasks assessed    Extremity/Trunk Assessment Upper Extremity Assessment Upper Extremity Assessment: Overall WFL for tasks assessed Lower Extremity Assessment Lower Extremity Assessment: Defer to PT evaluation Cervical / Trunk Assessment Cervical / Trunk Assessment: Kyphotic     Mobility Bed Mobility Bed Mobility: Not assessed (reports unable to lay flat supine at this time due to pain) Transfers Transfers: Sit to Stand;Stand to Sit Sit to Stand: 4: Min guard;With upper extremity assist;From chair/3-in-1 Stand to Sit: 4: Min guard;With upper extremity assist;To chair/3-in-1 Details for Transfer Assistance: cues for scoot toward front of chair to prevent bending and allowing UE to (A)      Exercise     Balance     End of Session OT - End of Session Activity Tolerance: Patient tolerated treatment well Patient left: in chair;with call bell/phone within reach Nurse Communication: Mobility status;Precautions  GO     [name] 05/25/2013, 1:15 PM Pager: [phone]

## 2013-05-26 NOTE — Progress Notes (Signed)
Orthopedic Tech Progress Note Patient Details:  NIRAJ KUDRNA Feb 22, 1951 [id number]  Patient ID: Paulette Blanch, male   DOB: 1951/08/06, 62 y.o.   MRN: [id number] Brace order completed by Storm Frisk, Kyra Laffey 05/26/2013, 4:58 PM

## 2013-05-26 NOTE — Progress Notes (Signed)
Physical Therapy Treatment Patient Details Name: [name] MRN: [id number] DOB: 1950-09-04 Today's Date: 05/26/2013 Time: 4098-1191 PT Time Calculation (min): 21 min  PT Assessment / Plan / Recommendation  History of Present Illness 62 y.o. male admitted to [facility] on 05/24/13 for elective L4/5 PLIF.     PT Comments   Pt is POD #2 and is moving better than yesterday, but still slow and guarded.  We need to start practicing stairs and bed mobility without railing tomorrow to simulate home environment.    Follow Up Recommendations  Home health PT;Supervision for mobility/OOB     Does the patient have the potential to tolerate intense rehabilitation    Yes  Barriers to Discharge   Per wife she will not be there during the day, she will be at work.        Equipment Recommendations  None recommended by PT    Recommendations for Other Services   None  Frequency Min 5X/week   Progress towards PT Goals Progress towards PT goals: Progressing toward goals  Plan Current plan remains appropriate    Precautions / Restrictions Precautions Precautions: Back Precaution Comments: reviewed back education with pt and wife, lifting restrictions Required Braces or Orthoses:  Marthe Patch MD ordering corset for pt-just now)   Pertinent Vitals/Pain See vitals flow sheet.     Mobility  Bed Mobility Rolling Right: 6: Modified independent (Device/Increase time);With rail Right Sidelying to Sit: 6: Modified independent (Device/Increase time);With rails;HOB elevated Sitting - Scoot to Edge of Bed: 6: Modified independent (Device/Increase time);With rail Details for Bed Mobility Assistance: Heavy reliance on railing for support.  Will need to start practicing without the railing.  Transfers Sit to Stand: 4: Min guard;With upper extremity assist;With armrests;From bed;From toilet Stand to Sit: 4: Min guard;With upper extremity assist;With armrests;To toilet Details for Transfer Assistance: min guard assist  for safety due to slow progression to and from standing.   Ambulation/Gait Ambulation/Gait Assistance: 4: Min guard Ambulation Distance (Feet): 150 Feet Assistive device: Rolling walker Ambulation/Gait Assistance Details: min guard assist for safety due to slow gait speed, still slightly flexed knees and trunk.  No leg buckling noted today Gait Pattern: Step-through pattern;Trunk flexed;Right flexed knee in stance;Left flexed knee in stance Gait velocity: significantly decreased General Gait Details: slow, garded gait      PT Goals (current goals can now be found in the care plan section) Acute Rehab PT Goals Patient Stated Goal: to return home  Visit Information  Last PT Received On: 05/26/13 Assistance Needed: +1 History of Present Illness: 62 y.o. male admitted to [facility] on 05/24/13 for elective L4/5 PLIF.      Subjective Data  Subjective: Pt's wife reports that he has been up on his own in the room.   Patient Stated Goal: to return home   Cognition  Cognition Arousal/Alertness: Awake/alert Behavior During Therapy: WFL for tasks assessed/performed Overall Cognitive Status: Within Functional Limits for tasks assessed    Balance  Static Sitting Balance Static Sitting - Balance Support: Bilateral upper extremity supported;Feet supported Static Sitting - Level of Assistance: 5: Stand by assistance Static Standing Balance Static Standing - Balance Support: Bilateral upper extremity supported Static Standing - Level of Assistance: 5: Stand by assistance Dynamic Standing Balance Dynamic Standing - Balance Support: Bilateral upper extremity supported Dynamic Standing - Level of Assistance: 4: Min assist  End of Session PT - End of Session Activity Tolerance: Patient limited by pain Patient left: Other (comment) (with OT, wife, and MD)  Rollene Rotunda Koralyn Prestage, PT, DPT [phone]   05/26/2013, 2:56 PM

## 2013-05-26 NOTE — Progress Notes (Signed)
Occupational Therapy Treatment Patient Details Name: STEAVEN WHOLEY MRN: [id number] DOB: 08/26/1950 Today's Date: 05/26/2013 Time: 4098-1191 OT Time Calculation (min): 31 min  OT Assessment / Plan / Recommendation  History of present illness 62 y.o. male admitted to [facility] on 05/24/13 for elective L4/5 PLIF.     OT comments  Pt is at adequate level from OT standpoint for d/c home. All education complete and no further questions at this time. Ot will continue to follow acutely.  Follow Up Recommendations  Home health OT;Supervision - Intermittent    Barriers to Discharge       Equipment Recommendations  None recommended by OT    Recommendations for Other Services    Frequency Min 2X/week   Progress towards OT Goals Progress towards OT goals: Progressing toward goals  Plan Discharge plan remains appropriate    Precautions / Restrictions Precautions Precautions: Back Precaution Comments: reviewed back education with pt and wife, lifting restrictions Required Braces or Orthoses:  (MD ordered lumbar brace )   Pertinent Vitals/Pain See vitals MD in room and removing bandage MD states okay to shower and avoid washing directly on wound    ADL  Lower Body Dressing: Modified independent Where Assessed - Lower Body Dressing: Unsupported sit to stand (with AE) Toilet Transfer: Supervision/safety Toilet Transfer Method: Sit to stand Toilet Transfer Equipment: Raised toilet seat with arms (or 3-in-1 over toilet) Equipment Used: Rolling walker Transfers/Ambulation Related to ADLs: pt ambulating with PT at beginning of session. See PT Chari Manning notes ADL Comments: Pt educated on using reacher for LB dressing and ability to purchase AE in gift shop on 1st floor. Wife plans to buy reacher. Pt will sponge bath due to bath being on second floor of the home. Pt will sleep on a couch and wife educated on using comforter to make a soild surface for patient. Pt concerned about x2 dogs at d/c home. Wife  and patient educated on safety with animals. Pt and wife without further questions at this time (educated pt /spouse on car transfer for pending d/c )    OT Diagnosis:    OT Problem List:   OT Treatment Interventions:     OT Goals(current goals can now be found in the care plan section) Acute Rehab OT Goals Patient Stated Goal: to return home OT Goal Formulation: With patient Time For Goal Achievement: 06/08/13 Potential to Achieve Goals: Good ADL Goals Pt Will Perform Grooming: with modified independence;standing Pt Will Perform Upper Body Bathing: with modified independence;standing Pt Will Perform Lower Body Bathing: with modified independence;sit to/from stand;with adaptive equipment Pt Will Transfer to Toilet: with modified independence;regular height toilet Pt Will Perform Tub/Shower Transfer: Tub transfer;with supervision  Visit Information  Last OT Received On: 05/26/13 Assistance Needed: +1 History of Present Illness: 62 y.o. male admitted to [facility] on 05/24/13 for elective L4/5 PLIF.      Subjective Data      Prior Functioning       Cognition  Cognition Arousal/Alertness: Awake/alert Behavior During Therapy: WFL for tasks assessed/performed Overall Cognitive Status: Within Functional Limits for tasks assessed    Mobility  Bed Mobility Bed Mobility: Not assessed Rolling Right: 6: Modified independent (Device/Increase time);With rail Right Sidelying to Sit: 6: Modified independent (Device/Increase time);With rails;HOB elevated Sitting - Scoot to Edge of Bed: 6: Modified independent (Device/Increase time);With rail Details for Bed Mobility Assistance: Heavy reliance on railing for support.  Will need to start practicing without the railing.  Transfers Sit to Stand: 4: Min guard;With  upper extremity assist;With armrests;From bed;From toilet Stand to Sit: 5: Supervision;With upper extremity assist;To chair/3-in-1 Details for Transfer Assistance: min guard assist for  safety due to slow progression to and from standing.      Exercises      Balance Static Sitting Balance Static Sitting - Balance Support: Bilateral upper extremity supported;Feet supported Static Sitting - Level of Assistance: 5: Stand by assistance Static Standing Balance Static Standing - Balance Support: Bilateral upper extremity supported Static Standing - Level of Assistance: 5: Stand by assistance Dynamic Standing Balance Dynamic Standing - Balance Support: Bilateral upper extremity supported Dynamic Standing - Level of Assistance: 4: Min assist   End of Session OT - End of Session Activity Tolerance: Patient tolerated treatment well Patient left: in chair;with call bell/phone within reach Nurse Communication: Mobility status;Precautions  GO     [name] 05/26/2013, 4:01 PM Pager: [phone]

## 2013-05-26 NOTE — Progress Notes (Signed)
OT NOTE  Pt told by MD Cabbell that he is allowed to shower and to leave wound open to air. Pt is not allowed to bath directly over open wound. OT present for questions and education.   Mateo Flow   OTR/L Pager: [phone] Office: [phone] .

## 2013-05-27 DIAGNOSIS — M4316 Spondylolisthesis, lumbar region: Secondary | ICD-10-CM | POA: Diagnosis present

## 2013-05-27 MED ORDER — OXYCODONE-ACETAMINOPHEN 5-325 MG PO TABS: 1.0000 | ORAL_TABLET | ORAL | Status: DC | PRN

## 2013-05-27 NOTE — Progress Notes (Signed)
Physical Therapy Treatment Patient Details Name: [name] MRN: [id number] DOB: [birth year] Today's Date: 05/27/2013 Time: 4098-1191 PT Time Calculation (min): 27 min  PT Assessment / Plan / Recommendation  History of Present Illness 62 y.o. male admitted to [facility] on 05/24/13 for elective L4/5 PLIF.     PT Comments   Pt progressing with mobility but still moves slow & guarded.  Pt states pain is better controlled & his legs feel stronger today.  Cont with current POC to maximize functional mobility prior to d/c home.     Follow Up Recommendations  Home health PT;Supervision for mobility/OOB     Does the patient have the potential to tolerate intense rehabilitation     Barriers to Discharge        Equipment Recommendations  None recommended by PT    Recommendations for Other Services    Frequency Min 5X/week   Progress towards PT Goals Progress towards PT goals: Progressing toward goals  Plan Current plan remains appropriate    Precautions / Restrictions Precautions Precautions: Back Precaution Comments: reviewed back education with pt and wife, lifting restrictions Required Braces or Orthoses: Spinal Brace Spinal Brace: Applied in sitting position (No order for brace, but brace present in room)   Pertinent Vitals/Pain 6/10 back.  RN notified.     Mobility  Bed Mobility Bed Mobility: Rolling Right;Right Sidelying to Sit Rolling Right: 6: Modified independent (Device/Increase time) Right Sidelying to Sit: 6: Modified independent (Device/Increase time);HOB flat Details for Bed Mobility Assistance: cues cues or physical (A) needed.  Just incr time to complete Transfers Transfers: Sit to Stand;Stand to Sit Sit to Stand: 4: Min guard;With upper extremity assist;From bed Stand to Sit: 5: Supervision;With upper extremity assist;With armrests;To chair/3-in-1 Details for Transfer Assistance: cues to decrease trunk flexion with sit>stand.   Ambulation/Gait Ambulation/Gait  Assistance: 4: Min guard Ambulation Distance (Feet): 250 Feet Assistive device: Rolling walker Ambulation/Gait Assistance Details: Pt with mild LOB initially but able to self correct himself.  Still very guarded & slow but improving as distance increased.   Gait Pattern: Step-through pattern;Decreased stride length Gait velocity: significantly decreased General Gait Details: slow, guarded.  Encouragement to decrease reliance of UE's on RW Stairs: Yes Stairs Assistance: 4: Min assist;4: Min guard Stairs Assistance Details (indicate cue type and reason): (A) provided via HHA with fwd technique using 1 rail.  Min guard with sideways technique.   Stair Management Technique: One rail Right;Step to pattern;Sideways;Forwards Number of Stairs: 6 Wheelchair Mobility Wheelchair Mobility: No      PT Goals (current goals can now be found in the care plan section) Acute Rehab PT Goals PT Goal Formulation: With patient Time For Goal Achievement: 06/01/13 Potential to Achieve Goals: Good  Visit Information  Last PT Received On: 05/27/13 Assistance Needed: +1 History of Present Illness: 62 y.o. male admitted to [facility] on 05/24/13 for elective L4/5 PLIF.      Subjective Data      Cognition  Cognition Arousal/Alertness: Awake/alert Behavior During Therapy: WFL for tasks assessed/performed Overall Cognitive Status: Within Functional Limits for tasks assessed    Balance     End of Session     GP     [name] 05/27/2013, 8:49 AM   Verdell Face, PTA [phone] 05/27/2013

## 2013-05-27 NOTE — Discharge Summary (Signed)
Physician Discharge Summary  Patient ID: DEONTRAY HUNNICUTT MRN: [id number] DOB/AGE: [birth year] 62 y.o.  Admit date: 05/24/2013 Discharge date: 05/27/2013  Admission Diagnoses:spondylolisthesis lumbar stenosis, L4/5, neurogenic claudication   Discharge Diagnoses: spondylolisthesis lumbar stenosis, L4/5, neurogenic claudication  Principal Problem:   Spondylolisthesis of lumbar region   Discharged Condition: good  Hospital Course: pt admitted on day of surgery  - underwent procedure below  - pt doing well, ambulating, voiding  Consults: None    Treatments: surgery: PROCEDURE: Procedure(s):  Lumbar Four-Five Posterior lumbar interbody fusion with interbody peek nuvasive 61mmx11mm prosthesis  Lumbar decompression L4/5 beyond what was necessary for a PLIF  posterior lateral arthrodesis morselized auto and allograft L4/5  posterior nonsegmental instrumentation Nuvasive medial to lateral trajectory pedicle screws       Discharge Exam: Blood pressure 120/71, pulse 101, temperature 98.5 F (36.9 C), temperature source Oral, resp. rate 20, height 5\' 7"  (1.702 m), weight 69.355 kg (152 lb 14.4 oz), SpO2 95.00%. Wound:c/d/i  Disposition: home  Discharge Orders   Future Orders Complete By Expires   Ambulatory referral to Home Health  As directed    Comments:     Please evaluate Paulette Blanch for admission to [facility].  Disciplines requested: home health pt, ot  Services to provide: Strengthening Exercises  Physician to follow patient's care (the person listed here will be responsible for signing ongoing orders): Referring Provider  Requested Start of Care Date: Tomorrow  Special Instructions:  none       Medication List    STOP taking these medications       traMADol 50 MG tablet  Commonly known as:  ULTRAM      TAKE these medications       aspirin EC 81 MG tablet  Take 81 mg by mouth daily.     ibuprofen 200 MG tablet  Commonly known as:  ADVIL,MOTRIN  Take  200-600 mg by mouth every 6 (six) hours as needed for pain.     lisinopril 20 MG tablet  Commonly known as:  PRINIVIL,ZESTRIL  Take 20 mg by mouth daily.     oxyCODONE-acetaminophen 5-325 MG per tablet  Commonly known as:  PERCOCET/ROXICET  Take 1-2 tablets by mouth every 4 (four) hours as needed.         SignedClydene Fake, MD 05/27/2013, 9:53 AM

## 2013-05-27 NOTE — Progress Notes (Signed)
Patient ID: [name], male   DOB: 12-25-1950, 62 y.o.   MRN: [id number] BP 138/76  Pulse 107  Temp(Src) 98.6 F (37 C) (Oral)  Resp 18  Ht 5\' 7"  (1.702 m)  Wt 69.355 kg (152 lb 14.4 oz)  BMI 23.94 kg/m2  SpO2 96% Alert and oriented x 4 Moving slowly with PT, steady with the walker May leave when he is ready, possible dc on Saturday, more likely on Sunday. Wound is clean, and dry.

## 2013-06-12 NOTE — Addendum Note (Signed)
Addendum created 06/12/13 7829 by Raiford Simmonds, MD   Modules edited: Anesthesia Attestations

## 2014-08-24 HISTORY — PX: BACK SURGERY: SHX140

## 2014-11-29 ENCOUNTER — Other Ambulatory Visit: Payer: Self-pay | Admitting: Sports Medicine

## 2014-11-29 DIAGNOSIS — M25611 Stiffness of right shoulder, not elsewhere classified: Secondary | ICD-10-CM

## 2014-12-08 ENCOUNTER — Ambulatory Visit
Admission: RE | Admit: 2014-12-08 | Discharge: 2014-12-08 | Disposition: A | Discharge status: Home / Self Care | Payer: BLUE CROSS/BLUE SHIELD | Source: Ambulatory Visit | Attending: Sports Medicine | Admitting: Sports Medicine

## 2014-12-08 DIAGNOSIS — M25611 Stiffness of right shoulder, not elsewhere classified: Secondary | ICD-10-CM

## 2018-08-24 HISTORY — PX: BACK SURGERY: SHX140

## 2019-05-31 ENCOUNTER — Other Ambulatory Visit: Payer: Self-pay | Admitting: Neurosurgery

## 2019-06-09 NOTE — Pre-Procedure Instructions (Signed)
[id number] [place], [place] - [address] [address] Phone: [phone] Fax: [phone]      Your procedure is scheduled on Thursday October 22nd.  Report to [facility] Main Entrance "A" at 12:00 PM, and check in at the Admitting office.  Call this number if you have problems the morning of surgery:  [phone]  Call [phone] if you have any questions prior to your surgery date Monday-Friday 8am-4pm    Remember:  Do not eat or drink after midnight the night before your surgery    Take these medicines the morning of surgery with A SIP OF WATER  oxyCODONE (OXY IR/ROXICODONE) if needed  Follow your surgeon's instructions on when to stop Asprin.  If no instructions were given by your surgeon then you will need to call the office to get those instructions.    As of today, STOP taking any Aspirin (unless otherwise instructed by your surgeon), Aleve, Naproxen, Ibuprofen, Motrin, Advil, Goody's, BC's, all herbal medications, fish oil, and all vitamins.    The Morning of Surgery  Do not wear jewelry, make-up or nail polish.  Do not wear lotions, powders, or perfumes/colognes, or deodorant  Do not shave 48 hours prior to surgery.  Men may shave face and neck.  Do not bring valuables to the hospital.  [facility] is not responsible for any belongings or valuables.  If you are a smoker, DO NOT Smoke 24 hours prior to surgery IF you wear a CPAP at night please bring your mask, tubing, and machine the morning of surgery   Remember that you must have someone to transport you home after your surgery, and remain with you for 24 hours if you are discharged the same day.   Contacts, glasses, hearing aids, dentures or bridgework may not be worn into surgery.    Leave your suitcase in the car.  After surgery it may be brought to your room.  For patients admitted to the hospital, discharge time will be determined by your treatment  team.  Patients discharged the day of surgery will not be allowed to drive home.    Special instructions:   [facility]- Preparing For Surgery  Before surgery, you can play an important role. Because skin is not sterile, your skin needs to be as free of germs as possible. You can reduce the number of germs on your skin by washing with CHG (chlorahexidine gluconate) Soap before surgery.  CHG is an antiseptic cleaner which kills germs and bonds with the skin to continue killing germs even after washing.    Oral Hygiene is also important to reduce your risk of infection.  Remember - BRUSH YOUR TEETH THE MORNING OF SURGERY WITH YOUR REGULAR TOOTHPASTE  Please do not use if you have an allergy to CHG or antibacterial soaps. If your skin becomes reddened/irritated stop using the CHG.  Do not shave (including legs and underarms) for at least 48 hours prior to first CHG shower. It is OK to shave your face.  Please follow these instructions carefully.   1. Shower the NIGHT BEFORE SURGERY and the MORNING OF SURGERY with CHG Soap.   2. If you chose to wash your hair, wash your hair first as usual with your normal shampoo.  3. After you shampoo, rinse your hair and body thoroughly to remove the shampoo.  4. Use CHG as you would any other liquid soap. You can apply CHG directly to the skin and wash gently  with a scrungie or a clean washcloth.   5. Apply the CHG Soap to your body ONLY FROM THE NECK DOWN.  Do not use on open wounds or open sores. Avoid contact with your eyes, ears, mouth and genitals (private parts). Wash Face and genitals (private parts)  with your normal soap.   6. Wash thoroughly, paying special attention to the area where your surgery will be performed.  7. Thoroughly rinse your body with warm water from the neck down.  8. DO NOT shower/wash with your normal soap after using and rinsing off the CHG Soap.  9. Pat yourself dry with a CLEAN TOWEL.  10. Wear CLEAN PAJAMAS to bed  the night before surgery, wear comfortable clothes the morning of surgery  11. Place CLEAN SHEETS on your bed the night of your first shower and DO NOT SLEEP WITH PETS.    Day of Surgery:  Do not apply any deodorants/lotions. Please shower the morning of surgery with the CHG soap  Please wear clean clothes to the hospital/surgery center.   Remember to brush your teeth WITH YOUR REGULAR TOOTHPASTE.   Please read over the following fact sheets that you were given.

## 2019-06-12 ENCOUNTER — Other Ambulatory Visit (HOSPITAL_COMMUNITY)
Admission: RE | Admit: 2019-06-12 | Discharge: 2019-06-12 | Disposition: A | Discharge status: Home / Self Care | Payer: Medicare Other | Source: Ambulatory Visit | Attending: Neurosurgery | Admitting: Neurosurgery

## 2019-06-12 ENCOUNTER — Encounter (HOSPITAL_COMMUNITY)
Admission: RE | Admit: 2019-06-12 | Discharge: 2019-06-12 | Disposition: A | Discharge status: Home / Self Care | Payer: Medicare Other | Source: Ambulatory Visit | Attending: Neurosurgery | Admitting: Neurosurgery

## 2019-06-12 ENCOUNTER — Encounter (HOSPITAL_COMMUNITY): Payer: Self-pay

## 2019-06-12 ENCOUNTER — Other Ambulatory Visit: Payer: Self-pay

## 2019-06-12 DIAGNOSIS — M4316 Spondylolisthesis, lumbar region: Secondary | ICD-10-CM | POA: Insufficient documentation

## 2019-06-12 DIAGNOSIS — Z01818 Encounter for other preprocedural examination: Secondary | ICD-10-CM | POA: Insufficient documentation

## 2019-06-12 DIAGNOSIS — I10 Essential (primary) hypertension: Secondary | ICD-10-CM | POA: Insufficient documentation

## 2019-06-12 LAB — SURGICAL PCR SCREEN
MRSA, PCR: NEGATIVE
Staphylococcus aureus: NEGATIVE

## 2019-06-12 LAB — TYPE AND SCREEN
ABO/RH(D): B POS
Antibody Screen: NEGATIVE

## 2019-06-12 LAB — CBC
HCT: 45.9 % (ref 39.0–52.0)
Hemoglobin: 16.5 g/dL (ref 13.0–17.0)
MCH: 34.1 pg — ABNORMAL HIGH (ref 26.0–34.0)
MCHC: 35.9 g/dL (ref 30.0–36.0)
MCV: 94.8 fL (ref 80.0–100.0)
Platelets: 254 10*3/uL (ref 150–400)
RBC: 4.84 MIL/uL (ref 4.22–5.81)
RDW: 11.9 % (ref 11.5–15.5)
WBC: 11.4 10*3/uL — ABNORMAL HIGH (ref 4.0–10.5)
nRBC: 0 % (ref 0.0–0.2)

## 2019-06-12 LAB — COMPREHENSIVE METABOLIC PANEL
ALT: 31 U/L (ref 0–44)
AST: 37 U/L (ref 15–41)
Albumin: 3.8 g/dL (ref 3.5–5.0)
Alkaline Phosphatase: 58 U/L (ref 38–126)
Anion gap: 10 (ref 5–15)
BUN: 8 mg/dL (ref 8–23)
CO2: 21 mmol/L — ABNORMAL LOW (ref 22–32)
Calcium: 9.3 mg/dL (ref 8.9–10.3)
Chloride: 104 mmol/L (ref 98–111)
Creatinine, Ser: 0.66 mg/dL (ref 0.61–1.24)
GFR calc Af Amer: >60 mL/min (ref >60)
GFR calc non Af Amer: >60 mL/min (ref >60)
Glucose, Bld: 111 mg/dL — ABNORMAL HIGH (ref 70–99)
Potassium: 4.1 mmol/L (ref 3.5–5.1)
Sodium: 135 mmol/L (ref 135–145)
Total Bilirubin: 0.6 mg/dL (ref 0.3–1.2)
Total Protein: 7 g/dL (ref 6.5–8.1)

## 2019-06-12 NOTE — Progress Notes (Signed)
PCP - Dr. Melinda Crutch Cardiologist - denies  PPM/ICD - N/A Device Orders -  Rep Notified -   Chest x-ray - N/A EKG - 06/12/19 Stress Test - denies ECHO - denies Cardiac Cath - denies  Sleep Study - denies CPAP - denies  Blood Thinner Instructions:N/A Aspirin Instructions: LD 06/07/19  ERAS Protcol -N/A PRE-SURGERY Ensure or G2-   COVID TEST- 06/12/19   Anesthesia review: No  Patient denies shortness of breath, fever, cough and chest pain at PAT appointment   All instructions explained to the patient, with a verbal understanding of the material. Patient agrees to go over the instructions while at home for a better understanding. Patient also instructed to self quarantine after being tested for COVID-19. The opportunity to ask questions was provided.   Coronavirus Screening  Have you experienced the following symptoms:  Cough yes/no: No Fever (>100.65F)  yes/no: No Runny nose yes/no: No Sore throat yes/no: No Difficulty breathing/shortness of breath  yes/no: No  Have you or a family member traveled in the last 14 days and where? yes/no: No   If the patient indicates "YES" to the above questions, their PAT will be rescheduled to limit the exposure to others and, the surgeon will be notified. THE PATIENT WILL NEED TO BE ASYMPTOMATIC FOR 14 DAYS.   If the patient is not experiencing any of these symptoms, the PAT nurse will instruct them to NOT bring anyone with them to their appointment since they may have these symptoms or traveled as well.   Please remind your patients and families that hospital visitation restrictions are in effect and the importance of the restrictions.

## 2019-06-13 LAB — NOVEL CORONAVIRUS, NAA (HOSP ORDER, SEND-OUT TO REF LAB; TAT 18-24 HRS): SARS-CoV-2, NAA: NOT DETECTED

## 2019-06-15 ENCOUNTER — Inpatient Hospital Stay (HOSPITAL_COMMUNITY): Payer: Medicare Other | Admitting: Certified Registered Nurse Anesthetist

## 2019-06-15 ENCOUNTER — Encounter (HOSPITAL_COMMUNITY): Payer: Self-pay

## 2019-06-15 ENCOUNTER — Encounter (HOSPITAL_COMMUNITY)
Admission: RE | Disposition: A | Discharge status: Home / Self Care | Payer: Self-pay | Source: Home / Self Care | Attending: Neurosurgery

## 2019-06-15 ENCOUNTER — Inpatient Hospital Stay (HOSPITAL_COMMUNITY)
Admission: RE | Admit: 2019-06-15 | Discharge: 2019-06-16 | DRG: 455 | Disposition: A | Discharge status: Home / Self Care | Payer: Medicare Other | Attending: Neurosurgery | Admitting: Neurosurgery

## 2019-06-15 ENCOUNTER — Other Ambulatory Visit: Payer: Self-pay

## 2019-06-15 ENCOUNTER — Inpatient Hospital Stay (HOSPITAL_COMMUNITY): Payer: Medicare Other

## 2019-06-15 DIAGNOSIS — M48062 Spinal stenosis, lumbar region with neurogenic claudication: Secondary | ICD-10-CM | POA: Diagnosis present

## 2019-06-15 DIAGNOSIS — M5126 Other intervertebral disc displacement, lumbar region: Secondary | ICD-10-CM | POA: Diagnosis present

## 2019-06-15 DIAGNOSIS — I10 Essential (primary) hypertension: Secondary | ICD-10-CM | POA: Diagnosis present

## 2019-06-15 DIAGNOSIS — Z419 Encounter for procedure for purposes other than remedying health state, unspecified: Secondary | ICD-10-CM

## 2019-06-15 DIAGNOSIS — Z20828 Contact with and (suspected) exposure to other viral communicable diseases: Secondary | ICD-10-CM | POA: Diagnosis present

## 2019-06-15 DIAGNOSIS — M4316 Spondylolisthesis, lumbar region: Secondary | ICD-10-CM | POA: Diagnosis present

## 2019-06-15 SURGERY — POSTERIOR LUMBAR FUSION 1 LEVEL
Anesthesia: General | Site: Spine Lumbar

## 2019-06-15 MED ORDER — FENTANYL CITRATE (PF) 100 MCG/2ML IJ SOLN
INTRAMUSCULAR | Status: AC
  Filled 2019-06-15: qty 2

## 2019-06-15 MED ORDER — MIDAZOLAM HCL 5 MG/5ML IJ SOLN
INTRAMUSCULAR | Status: DC | PRN
  Administered 2019-06-15: 2 mg via INTRAVENOUS

## 2019-06-15 MED ORDER — ROCURONIUM BROMIDE 50 MG/5ML IV SOSY
PREFILLED_SYRINGE | INTRAVENOUS | Status: DC | PRN
  Administered 2019-06-15: 20 mg via INTRAVENOUS
  Administered 2019-06-15: 60 mg via INTRAVENOUS

## 2019-06-15 MED ORDER — POTASSIUM CHLORIDE IN NACL 20-0.9 MEQ/L-% IV SOLN: INTRAVENOUS | Status: DC

## 2019-06-15 MED ORDER — ACETAMINOPHEN 325 MG PO TABS
650.0000 mg | ORAL_TABLET | ORAL | Status: DC | PRN
  Administered 2019-06-15 – 2019-06-16 (×2): 650 mg via ORAL
  Filled 2019-06-15 (×3): qty 2

## 2019-06-15 MED ORDER — BUPIVACAINE HCL (PF) 0.5 % IJ SOLN
INTRAMUSCULAR | Status: DC | PRN
  Administered 2019-06-15: 25 mL
  Administered 2019-06-15: 10 mL

## 2019-06-15 MED ORDER — ONDANSETRON HCL 4 MG PO TABS: 4.0000 mg | ORAL_TABLET | Freq: Four times a day (QID) | ORAL | Status: DC | PRN

## 2019-06-15 MED ORDER — LIDOCAINE-EPINEPHRINE 0.5 %-1:200000 IJ SOLN
INTRAMUSCULAR | Status: DC | PRN
  Administered 2019-06-15: 10 mL

## 2019-06-15 MED ORDER — ASPIRIN EC 81 MG PO TBEC
81.0000 mg | DELAYED_RELEASE_TABLET | Freq: Every day | ORAL | Status: DC
  Administered 2019-06-16: 09:00:00 81 mg via ORAL
  Filled 2019-06-15: qty 1

## 2019-06-15 MED ORDER — SODIUM CHLORIDE 0.9 % IV SOLN
INTRAVENOUS | Status: DC | PRN
  Administered 2019-06-15: 35 ug/min via INTRAVENOUS

## 2019-06-15 MED ORDER — OXYCODONE HCL ER 10 MG PO T12A
10.0000 mg | EXTENDED_RELEASE_TABLET | Freq: Two times a day (BID) | ORAL | Status: DC
  Administered 2019-06-15 – 2019-06-16 (×2): 10 mg via ORAL
  Filled 2019-06-15 (×2): qty 1

## 2019-06-15 MED ORDER — DIAZEPAM 5 MG PO TABS
5.0000 mg | ORAL_TABLET | Freq: Four times a day (QID) | ORAL | Status: DC | PRN
  Administered 2019-06-15: 23:00:00 5 mg via ORAL
  Filled 2019-06-15: qty 1

## 2019-06-15 MED ORDER — PROPOFOL 10 MG/ML IV BOLUS
INTRAVENOUS | Status: AC
  Filled 2019-06-15: qty 20

## 2019-06-15 MED ORDER — ONDANSETRON HCL 4 MG/2ML IJ SOLN: 4.0000 mg | Freq: Once | INTRAMUSCULAR | Status: DC | PRN

## 2019-06-15 MED ORDER — DEXAMETHASONE SODIUM PHOSPHATE 10 MG/ML IJ SOLN
INTRAMUSCULAR | Status: DC | PRN
  Administered 2019-06-15: 10 mg via INTRAVENOUS

## 2019-06-15 MED ORDER — OXYCODONE HCL 5 MG PO TABS
5.0000 mg | ORAL_TABLET | ORAL | Status: DC | PRN
  Administered 2019-06-15 – 2019-06-16 (×3): 5 mg via ORAL
  Filled 2019-06-15 (×3): qty 1

## 2019-06-15 MED ORDER — SODIUM CHLORIDE 0.9% FLUSH: 3.0000 mL | INTRAVENOUS | Status: DC | PRN

## 2019-06-15 MED ORDER — ZOLPIDEM TARTRATE 5 MG PO TABS: 5.0000 mg | ORAL_TABLET | Freq: Every evening | ORAL | Status: DC | PRN

## 2019-06-15 MED ORDER — CELECOXIB 200 MG PO CAPS
200.0000 mg | ORAL_CAPSULE | Freq: Two times a day (BID) | ORAL | Status: DC
  Administered 2019-06-15 – 2019-06-16 (×2): 200 mg via ORAL
  Filled 2019-06-15 (×2): qty 1

## 2019-06-15 MED ORDER — MENTHOL 3 MG MT LOZG: 1.0000 | LOZENGE | OROMUCOSAL | Status: DC | PRN

## 2019-06-15 MED ORDER — LOSARTAN POTASSIUM 50 MG PO TABS
100.0000 mg | ORAL_TABLET | Freq: Every day | ORAL | Status: DC
  Administered 2019-06-15 – 2019-06-16 (×2): 100 mg via ORAL
  Filled 2019-06-15 (×2): qty 2

## 2019-06-15 MED ORDER — SODIUM CHLORIDE 0.9% FLUSH: 3.0000 mL | Freq: Two times a day (BID) | INTRAVENOUS | Status: DC

## 2019-06-15 MED ORDER — FENTANYL CITRATE (PF) 250 MCG/5ML IJ SOLN
INTRAMUSCULAR | Status: AC
  Filled 2019-06-15: qty 5

## 2019-06-15 MED ORDER — CEFAZOLIN SODIUM-DEXTROSE 2-3 GM-%(50ML) IV SOLR
INTRAVENOUS | Status: DC | PRN
  Administered 2019-06-15: 2 g via INTRAVENOUS

## 2019-06-15 MED ORDER — LIDOCAINE 2% (20 MG/ML) 5 ML SYRINGE
INTRAMUSCULAR | Status: DC | PRN
  Administered 2019-06-15: 80 mg via INTRAVENOUS

## 2019-06-15 MED ORDER — ONDANSETRON HCL 4 MG/2ML IJ SOLN: 4.0000 mg | Freq: Four times a day (QID) | INTRAMUSCULAR | Status: DC | PRN

## 2019-06-15 MED ORDER — EPHEDRINE SULFATE 50 MG/ML IJ SOLN
INTRAMUSCULAR | Status: DC | PRN
  Administered 2019-06-15: 15 mg via INTRAVENOUS
  Administered 2019-06-15: 10 mg via INTRAVENOUS

## 2019-06-15 MED ORDER — LABETALOL HCL 5 MG/ML IV SOLN
INTRAVENOUS | Status: DC | PRN
  Administered 2019-06-15: 5 mg via INTRAVENOUS
  Administered 2019-06-15: 15 mg via INTRAVENOUS
  Administered 2019-06-15: 10 mg via INTRAVENOUS

## 2019-06-15 MED ORDER — BUPIVACAINE HCL (PF) 0.5 % IJ SOLN
INTRAMUSCULAR | Status: AC
  Filled 2019-06-15: qty 30

## 2019-06-15 MED ORDER — LABETALOL HCL 5 MG/ML IV SOLN
INTRAVENOUS | Status: AC
  Filled 2019-06-15: qty 4

## 2019-06-15 MED ORDER — LIDOCAINE-EPINEPHRINE 0.5 %-1:200000 IJ SOLN
INTRAMUSCULAR | Status: AC
  Filled 2019-06-15: qty 1

## 2019-06-15 MED ORDER — FENTANYL CITRATE (PF) 100 MCG/2ML IJ SOLN
25.0000 ug | INTRAMUSCULAR | Status: DC | PRN
  Administered 2019-06-15 (×2): 50 ug via INTRAVENOUS

## 2019-06-15 MED ORDER — MIDAZOLAM HCL 2 MG/2ML IJ SOLN
INTRAMUSCULAR | Status: AC
  Filled 2019-06-15: qty 2

## 2019-06-15 MED ORDER — THROMBIN 20000 UNITS EX SOLR
CUTANEOUS | Status: AC
  Filled 2019-06-15: qty 20000

## 2019-06-15 MED ORDER — 0.9 % SODIUM CHLORIDE (POUR BTL) OPTIME
TOPICAL | Status: DC | PRN
  Administered 2019-06-15: 1000 mL

## 2019-06-15 MED ORDER — ACETAMINOPHEN 650 MG RE SUPP: 650.0000 mg | RECTAL | Status: DC | PRN

## 2019-06-15 MED ORDER — LACTATED RINGERS IV SOLN
INTRAVENOUS | Status: DC
  Administered 2019-06-15 (×2): via INTRAVENOUS

## 2019-06-15 MED ORDER — OXYCODONE HCL 5 MG/5ML PO SOLN: 5.0000 mg | Freq: Once | ORAL | Status: DC | PRN

## 2019-06-15 MED ORDER — MORPHINE SULFATE (PF) 2 MG/ML IV SOLN: 2.0000 mg | INTRAVENOUS | Status: DC | PRN

## 2019-06-15 MED ORDER — FENTANYL CITRATE (PF) 100 MCG/2ML IJ SOLN
INTRAMUSCULAR | Status: DC | PRN
  Administered 2019-06-15 (×3): 50 ug via INTRAVENOUS
  Administered 2019-06-15 (×2): 100 ug via INTRAVENOUS

## 2019-06-15 MED ORDER — PROPOFOL 10 MG/ML IV BOLUS
INTRAVENOUS | Status: DC | PRN
  Administered 2019-06-15: 150 mg via INTRAVENOUS

## 2019-06-15 MED ORDER — PHENOL 1.4 % MT LIQD: 1.0000 | OROMUCOSAL | Status: DC | PRN

## 2019-06-15 MED ORDER — ONDANSETRON HCL 4 MG/2ML IJ SOLN
INTRAMUSCULAR | Status: DC | PRN
  Administered 2019-06-15 (×2): 4 mg via INTRAVENOUS

## 2019-06-15 MED ORDER — SODIUM CHLORIDE 0.9 % IV SOLN: 250.0000 mL | INTRAVENOUS | Status: DC

## 2019-06-15 MED ORDER — OXYCODONE HCL 5 MG PO TABS: 5.0000 mg | ORAL_TABLET | Freq: Once | ORAL | Status: DC | PRN

## 2019-06-15 MED ORDER — SUGAMMADEX SODIUM 200 MG/2ML IV SOLN
INTRAVENOUS | Status: DC | PRN
  Administered 2019-06-15: 200 mg via INTRAVENOUS

## 2019-06-15 MED ORDER — THROMBIN 20000 UNITS EX SOLR
CUTANEOUS | Status: DC | PRN
  Administered 2019-06-15: 16:00:00 20 mL

## 2019-06-15 SURGICAL SUPPLY — 65 items
BENZOIN TINCTURE PRP APPL 2/3 (GAUZE/BANDAGES/DRESSINGS) IMPLANT
BIT DRILL PLIF MAS DISP 5.5MM (DRILL) ×1 IMPLANT
BLADE CLIPPER SURG (BLADE) IMPLANT
BUR MATCHSTICK NEURO 3.0 LAGG (BURR) ×3 IMPLANT
BUR PRECISION FLUTE 5.0 (BURR) ×3 IMPLANT
CAGE POST IBF 10X8D 26/9 (Cage) ×6 IMPLANT
CANISTER SUCT 3000ML PPV (MISCELLANEOUS) ×3 IMPLANT
CARTRIDGE OIL MAESTRO DRILL (MISCELLANEOUS) ×1 IMPLANT
CLOSURE WOUND 1/2 X4 (GAUZE/BANDAGES/DRESSINGS)
CONT SPEC 4OZ CLIKSEAL STRL BL (MISCELLANEOUS) ×3 IMPLANT
COVER BACK TABLE 60X90IN (DRAPES) ×3 IMPLANT
DECANTER SPIKE VIAL GLASS SM (MISCELLANEOUS) ×3 IMPLANT
DERMABOND ADVANCED (GAUZE/BANDAGES/DRESSINGS) ×2
DERMABOND ADVANCED .7 DNX12 (GAUZE/BANDAGES/DRESSINGS) ×1 IMPLANT
DIFFUSER DRILL AIR PNEUMATIC (MISCELLANEOUS) ×3 IMPLANT
DRAPE C-ARM 42X72 X-RAY (DRAPES) ×3 IMPLANT
DRAPE C-ARMOR (DRAPES) ×3 IMPLANT
DRAPE LAPAROTOMY 100X72X124 (DRAPES) ×3 IMPLANT
DRAPE POUCH INSTRU U-SHP 10X18 (DRAPES) IMPLANT
DRAPE SURG 17X23 STRL (DRAPES) ×3 IMPLANT
DRILL PLIF MAS DISP 5.5MM (DRILL) ×3
DURAPREP 26ML APPLICATOR (WOUND CARE) ×3 IMPLANT
ELECT REM PT RETURN 9FT ADLT (ELECTROSURGICAL) ×3
ELECTRODE REM PT RTRN 9FT ADLT (ELECTROSURGICAL) ×1 IMPLANT
GAUZE 4X4 16PLY RFD (DISPOSABLE) IMPLANT
GAUZE SPONGE 4X4 12PLY STRL (GAUZE/BANDAGES/DRESSINGS) IMPLANT
GLOVE BIO SURGEON STRL SZ 6.5 (GLOVE) ×8 IMPLANT
GLOVE BIO SURGEONS STRL SZ 6.5 (GLOVE) ×4
GLOVE BIOGEL PI IND STRL 6.5 (GLOVE) ×2 IMPLANT
GLOVE BIOGEL PI INDICATOR 6.5 (GLOVE) ×4
GLOVE ECLIPSE 6.5 STRL STRAW (GLOVE) ×6 IMPLANT
GLOVE SURG SS PI 7.0 STRL IVOR (GLOVE) ×12 IMPLANT
GLOVE SURG SS PI 7.5 STRL IVOR (GLOVE) ×6 IMPLANT
GOWN STRL REUS W/ TWL LRG LVL3 (GOWN DISPOSABLE) ×3 IMPLANT
GOWN STRL REUS W/ TWL XL LVL3 (GOWN DISPOSABLE) IMPLANT
GOWN STRL REUS W/TWL 2XL LVL3 (GOWN DISPOSABLE) IMPLANT
GOWN STRL REUS W/TWL LRG LVL3 (GOWN DISPOSABLE) ×6
GOWN STRL REUS W/TWL XL LVL3 (GOWN DISPOSABLE)
KIT BASIN OR (CUSTOM PROCEDURE TRAY) ×3 IMPLANT
KIT POSITION SURG JACKSON T1 (MISCELLANEOUS) ×3 IMPLANT
KIT TURNOVER KIT B (KITS) ×3 IMPLANT
MILL MEDIUM DISP (BLADE) ×3 IMPLANT
NEEDLE HYPO 25X1 1.5 SAFETY (NEEDLE) ×3 IMPLANT
NEEDLE SPNL 18GX3.5 QUINCKE PK (NEEDLE) ×3 IMPLANT
NS IRRIG 1000ML POUR BTL (IV SOLUTION) ×3 IMPLANT
OIL CARTRIDGE MAESTRO DRILL (MISCELLANEOUS) ×3
PACK LAMINECTOMY NEURO (CUSTOM PROCEDURE TRAY) ×3 IMPLANT
PAD ARMBOARD 7.5X6 YLW CONV (MISCELLANEOUS) ×6 IMPLANT
ROD 5.5X40MM (Rod) ×6 IMPLANT
SCREW LOCK (Screw) ×8 IMPLANT
SCREW LOCK FXNS SPNE MAS PL (Screw) ×4 IMPLANT
SCREW SHANKS 5.5X35 (Screw) ×6 IMPLANT
SCREW TULIP 5.5 (Screw) ×6 IMPLANT
SPONGE LAP 4X18 RFD (DISPOSABLE) IMPLANT
SPONGE SURGIFOAM ABS GEL 100 (HEMOSTASIS) ×3 IMPLANT
STRIP CLOSURE SKIN 1/2X4 (GAUZE/BANDAGES/DRESSINGS) IMPLANT
SUT PROLENE 6 0 BV (SUTURE) IMPLANT
SUT VIC AB 0 CT1 18XCR BRD8 (SUTURE) ×1 IMPLANT
SUT VIC AB 0 CT1 8-18 (SUTURE) ×2
SUT VIC AB 2-0 CT1 18 (SUTURE) ×3 IMPLANT
SUT VIC AB 3-0 SH 8-18 (SUTURE) ×3 IMPLANT
TOWEL GREEN STERILE (TOWEL DISPOSABLE) ×3 IMPLANT
TOWEL GREEN STERILE FF (TOWEL DISPOSABLE) ×3 IMPLANT
TRAY FOLEY MTR SLVR 16FR STAT (SET/KITS/TRAYS/PACK) ×3 IMPLANT
WATER STERILE IRR 1000ML POUR (IV SOLUTION) ×3 IMPLANT

## 2019-06-15 NOTE — Anesthesia Preprocedure Evaluation (Signed)
Anesthesia Evaluation  Patient identified by MRN, date of birth, ID band Patient awake    Reviewed: Allergy & Precautions, NPO status , Patient's Chart, lab work & pertinent test results  History of Anesthesia Complications Negative for: history of anesthetic complications  Airway Mallampati: II  TM Distance: >3 FB Neck ROM: Full    Dental  (+) Edentulous Upper, Edentulous Lower   Pulmonary Patient abstained from smoking., former smoker,    Pulmonary exam normal        Cardiovascular hypertension, Normal cardiovascular exam     Neuro/Psych negative neurological ROS  negative psych ROS   GI/Hepatic negative GI ROS, (+)     substance abuse  alcohol use,   Endo/Other  negative endocrine ROS  Renal/GU negative Renal ROS  negative genitourinary   Musculoskeletal negative musculoskeletal ROS (+)   Abdominal   Peds  Hematology negative hematology ROS (+)   Anesthesia Other Findings   Reproductive/Obstetrics                            Anesthesia Physical Anesthesia Plan  ASA: III  Anesthesia Plan: General   Post-op Pain Management:    Induction: Intravenous  PONV Risk Score and Plan: 1 and Ondansetron, Dexamethasone, Treatment may vary due to age or medical condition and Midazolam  Airway Management Planned: Oral ETT  Additional Equipment: None  Intra-op Plan:   Post-operative Plan: Extubation in OR  Informed Consent: I have reviewed the patients History and Physical, chart, labs and discussed the procedure including the risks, benefits and alternatives for the proposed anesthesia with the patient or authorized representative who has indicated his/her understanding and acceptance.     Dental advisory given  Plan Discussed with:   Anesthesia Plan Comments:        Anesthesia Quick Evaluation

## 2019-06-15 NOTE — Anesthesia Postprocedure Evaluation (Signed)
Anesthesia Post Note  Patient: [name]  Procedure(s) Performed: Lumbar Three-Four Posterior lumbar interbody fusion (N/A Spine Lumbar)     Patient location during evaluation: PACU Anesthesia Type: General Level of consciousness: awake and alert Pain management: pain level controlled Vital Signs Assessment: post-procedure vital signs reviewed and stable Respiratory status: spontaneous breathing, nonlabored ventilation, respiratory function stable and patient connected to nasal cannula oxygen Cardiovascular status: blood pressure returned to baseline and stable Postop Assessment: no apparent nausea or vomiting Anesthetic complications: no    Last Vitals:  Vitals:   06/15/19 1934 06/15/19 2010  BP: (!) 175/94 (!) 174/97  Pulse: 79 75  Resp: 11 16  Temp:  36.6 C  SpO2: 99% 100%    Last Pain:  Vitals:   06/15/19 2010  TempSrc: Oral  PainSc:                  [name]

## 2019-06-15 NOTE — H&P (Signed)
BP (!) 182/93   Pulse 79   Temp 98 F (36.7 C) (Oral)   Resp 20   Ht 5' 6.5" (1.689 m)   Wt 65.8 kg   SpO2 98%   BMI 23.05 kg/m   [name] is a gentleman whom I took to the operating room few years ago, 2015, I believe, for an L4-5 fusion.  [name] did exceedingly well, having no problems, and since has left the practice.  He, however, returns today because he is having very severe pain.  His operation was on May 24, 2013.  He comes back today because since the end of July, he has had severe pain, left greater than right in the lower extremities.  Feels like he does not have legs in his explanation.  He had been doing some heavy yard work prior to this.  He stayed in the condo on a vacation last week because he was unable to take to 4 flights of stairs.  He is 5 feet 7 inches, weighing 143 pounds, blood pressure is 160/84, pulse is 94, temperature is 96.8.  Pain is 7/10.  [name], on his registration form, is 68 years of age, left handed.  He does not smoke.  He is currently retired.  He gave no information about alcohol or substance abuse history.  He has a history of hypertension.  The only surgery was that in 2014 that I performed.  He takes a medication for blood pressure, 81 mg of Aspirin, and Fish Oil.  Mother and father are both deceased.  In his words, he has had severe leg pain for 3 weeks.  Feels that it is most likely in the back.  The pain has gotten worse, weakness in the legs, numbness and tingling in both.  He has lost weight due to the pain.  He reports balance problems, leg pain with walking, leg weakness, back pain, leg pain at rest.     On exam, he is alert, obvious antalgic gait favoring the left lower extremity, moving very slowly, but steady.  He has 2+ reflexes at the knees, 2+ reflexes at the ankles.  Normal muscle tone, bulk, and coordination.  Normal muscle tone, bulk, and coordination in the upper extremities along with reflexes at the biceps, triceps, and  brachioradialis.  He has a negative Romberg exam.  Pupils equal, round, react to light.  Full extraocular movements.  Full visual fields.  Hearing intact to voice.       [name] has no new films, so I did do an x-ray today.  He has a solid arthrodesis at L4-5.  No hardware is out of place.   this is the reason for his discomfort.  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  He is 5 feet 7 inches, weighs 143 pounds.  Temperature is 97.1, blood pressure is 133/87, pulse is 101.  Pain is 10/10.  I will have [name] undergo a L3-4 posterior lumbar interbody arthrodesis with connection to the L4-5 fusion.  Risks and benefits he is quite aware of, having had this operation.

## 2019-06-15 NOTE — Op Note (Signed)
06/15/2019  7:02 PM  PATIENT:  [name]  68 y.o. male  PRE-OPERATIVE DIAGNOSIS:  Acquired spondylolisthesis L3/4, lumbar stenosis with neurogenic claudication POST-OPERATIVE DIAGNOSIS:  Acquired spondylolisthesis L3/4, Lumbar stenosis with neurogenic claudication PROCEDURE:  Procedure(s): Lumbar Three-Four Posterior lumbar interbody fusion 2. Laminectomy in excess of needed exposure for a PLIF 3.non segmental pedicle screw fixation Nuvasive L3/4 4 L5 pedicle screw removal  SURGEON:  Surgeon(s): Ashok Pall, MD Newman Pies, MD  ASSISTANTS:Jenkins, Dellis Filbert  ANESTHESIA:   general  EBL:  No intake/output data recorded.  BLOOD ADMINISTERED:none  CELL SAVER GIVEN:none  COUNT:per nursing  DRAINS: none   SPECIMEN:  No Specimen  DICTATION: [name] is a 68 y.o. male whom was taken to the operating room intubated, and placed under a general anesthetic without difficulty. A foley catheter was placed under sterile conditions. He was positioned prone on a Jackson stable with all pressure points properly padded.  His lumbar region was prepped and draped in a sterile manner. I infiltrated 20cc's 1/2%lidocaine/1:2000,000 strength epinephrine into the planned incision. I opened the skin with a 10 blade and took the incision down to the thoracolumbar fascia. I exposed the lamina of L2,and L3 in a subperiosteal fashion bilaterally. I confirmed my location with an intraoperative xray.  I placed self retaining retractors and started the decompression.  I decompressed the spinal canal via a complete laminectomy of L3, and inferior L3 facetectomies. This allowed for a complete unroofing of the neural  Foramina, and decompression of the lateral recesses. I used the drill and Kerrison punches to remove the bone and ligament.  PLIF's were performed at L3/4 in the same fashion. I opened the disc space with a 15 blade then used a variety of instruments to remove the disc and prepare the  space for the arthrodesis. I used curettes, rongeurs, punches, shavers for the disc space, and rasps in the discetomy. I measured the disc space and placed 10x81mm Conduit titanium cages(Synthes) into the disc space(s). The cages and disc space were packed with autograft morsels. I decorticated the lateral bone at L3, and L4 on the right. I then placed autograft morsels on the decorticated surfaces to complete the posterolateral arthrodesis.  We placed pedicle screws at L3, using fluoroscopic guidance. I drilled a pilot hole, then cannulated the pedicle with a drill at each site. We then tapped each pedicle, assessing each site for pedicle violations. No cutouts were appreciated. Screws [name]) were then placed at each site without difficulty. We attached rods and locking caps with the appropriate tools. The locking caps were secured with torque limited screwdrivers. Final films were performed and the final construct appeared to be in good position.  I closed the wound in a layered fashion. I approximated the thoracolumbar fascia, subcutaneous, and subcuticular planes with vicryl sutures. I used dermabond and an occlusive bandage for a sterile dressing.     PLAN OF CARE: Admit to inpatient   PATIENT DISPOSITION:  PACU - hemodynamically stable.   Delay start of Pharmacological VTE agent (>24hrs) due to surgical blood loss or risk of bleeding:  yes

## 2019-06-15 NOTE — Progress Notes (Addendum)
    06/15/19 1206 06/15/19 1209 06/15/19 1233  Vitals  BP (!) 179/91 (!) 179/90 (!) 189/90    06/15/19 1243  Vitals  BP (!) 187/83   Patient stated he has not taken his Losartan since 06/08/2019. Notified Dr. Christella Hartigan about elevated pressures. No new orders at this time. Will continue to monitor.

## 2019-06-15 NOTE — Transfer of Care (Signed)
Immediate Anesthesia Transfer of Care Note  Patient: [name]  Procedure(s) Performed: Lumbar Three-Four Posterior lumbar interbody fusion (N/A Spine Lumbar)  Patient Location: PACU  Anesthesia Type:General  Level of Consciousness: awake, alert , oriented and patient cooperative  Airway & Oxygen Therapy: Patient Spontanous Breathing and Patient connected to nasal cannula oxygen  Post-op Assessment: Report given to RN and Post -op Vital signs reviewed and stable  Post vital signs: Reviewed and stable  Last Vitals:  Vitals Value Taken Time  BP 166/97 06/15/19 1849  Temp    Pulse 87 06/15/19 1850  Resp 18 06/15/19 1850  SpO2 100 % 06/15/19 1850  Vitals shown include unvalidated device data.  Last Pain:  Vitals:   06/15/19 1228  TempSrc:   PainSc: 0-No pain      Patients Stated Pain Goal: 2 (53/29/92 4268)  Complications: No apparent anesthesia complications

## 2019-06-16 MED ORDER — TIZANIDINE HCL 4 MG PO TABS: 4.0000 mg | ORAL_TABLET | Freq: Four times a day (QID) | ORAL | 0 refills | Status: DC | PRN

## 2019-06-16 MED ORDER — OXYCODONE HCL 5 MG PO TABS: 5.0000 mg | ORAL_TABLET | Freq: Four times a day (QID) | ORAL | 0 refills | Status: DC | PRN

## 2019-06-16 NOTE — Evaluation (Signed)
Occupational Therapy Evaluation Patient Details Name: [name] MRN: [id number] DOB: 1950/11/29 Today's Date: 06/16/2019    History of Present Illness Pt is a 68 yo male s/p L3-4 PLIF. PMHx: prior back sx.   Clinical Impression   Pt PTA: Pt living with spouse and reports near independence until recently unable to mobilize well and requiring assist for ADL.Pt able to perform a variation of figure 4 technique and education completed on AE hip kit, but pt uninterested in hip kit. Pt reports that he has a Secondary school teacher at home. Education relating to reacher provided. Pt denied need for getting dressed at this time. Pt plans to live on 1st floor set-up until ready for a full flight of stairs. Back handout provided and reviewed ADL in detail. Pt educated on: clothing between brace, never sleep in brace, set an alarm at night for medication, avoid sitting for long periods of time, correct bed positioning for sleeping, correct sequence for bed mobility, avoiding lifting more than 5 pounds and never wash directly over incision.  Pt does not require continued OT skilled services. OT signing off.    Follow Up Recommendations  No OT follow up    Equipment Recommendations  None recommended by OT    Recommendations for Other Services       Precautions / Restrictions Precautions Precautions: Back Precaution Booklet Issued: Yes (comment) Precaution Comments: verbally discussed handout Required Braces or Orthoses: Spinal Brace Spinal Brace: Lumbar corset;Applied in standing position Restrictions Weight Bearing Restrictions: No      Mobility Bed Mobility Overal bed mobility: Needs Assistance Bed Mobility: Sidelying to Sit;Sit to Supine;Sit to Sidelying   Sidelying to sit: Supervision   Sit to supine: Supervision Sit to sidelying: Supervision General bed mobility comments: SupervisionA for performing log roll technique. + use of rail  Transfers Overall transfer level: Needs  assistance Equipment used: Rolling walker (2 wheeled) Transfers: Sit to/from Stand Sit to Stand: Supervision         General transfer comment: for initial standing balance    Balance Overall balance assessment: Mild deficits observed, not formally tested                                         ADL either performed or assessed with clinical judgement   ADL Overall ADL's : Needs assistance/impaired Eating/Feeding: Modified independent;Sitting   Grooming: Supervision/safety;Standing   Upper Body Bathing: Set up;Sitting   Lower Body Bathing: Minimal assistance;Sitting/lateral leans;Sit to/from stand   Upper Body Dressing : Set up;Sitting   Lower Body Dressing: Minimal assistance;+2 for physical assistance;+2 for safety/equipment;Cueing for safety;Sitting/lateral leans;Sit to/from stand;Adhering to back precautions   Toilet Transfer: Supervision/safety;Ambulation;Comfort height toilet;Grab bars   Toileting- Clothing Manipulation and Hygiene: Supervision/safety;Sitting/lateral lean;Sit to/from stand       Functional mobility during ADLs: Supervision/safety;Rolling walker General ADL Comments: Pt able to perform a variation od figure 4 technique and education completed on AE hip kit, but pt uninterested in hip kit. Pt reports that he has a Secondary school teacher at home. Education relating to reacher provided. Pt denied need for getting dressed at this time.     Vision Baseline Vision/History: No visual deficits Patient Visual Report: No change from baseline Vision Assessment?: No apparent visual deficits     Perception     Praxis      Pertinent Vitals/Pain Pain Assessment: No/denies pain     Hand Dominance Right  Extremity/Trunk Assessment Upper Extremity Assessment Upper Extremity Assessment: Generalized weakness   Lower Extremity Assessment Lower Extremity Assessment: Generalized weakness   Cervical / Trunk Assessment Cervical / Trunk Assessment: Other  exceptions Cervical / Trunk Exceptions: s/p lumbar sx   Communication Communication Communication: No difficulties   Cognition Arousal/Alertness: Awake/alert Behavior During Therapy: WFL for tasks assessed/performed Overall Cognitive Status: Within Functional Limits for tasks assessed                                     General Comments       Exercises     Shoulder Instructions      Home Living Family/patient expects to be discharged to:: Private residence Living Arrangements: Spouse/significant other Available Help at Discharge: Family;Available 24 hours/day(for 9 days then intermittently.) Type of Home: House Home Access: Stairs to enter [employer] of Steps: 3 Entrance Stairs-Rails: Right Home Layout: Two level;1/2 bath on main level;Bed/bath upstairs Alternate Level Stairs-Number of Steps: full flight   Bathroom Shower/Tub: Walk-in Corporate treasurer Toilet: Handicapped height     Home Equipment: Environmental consultant - 2 wheels;Hand held shower head;Shower seat;Adaptive equipment;Grab bars - tub/shower;Grab bars - toilet Adaptive Equipment: Reacher(urinal)        Prior Functioning/Environment Level of Independence: Independent with assistive device(s)        Comments: RW for mobility        OT Problem List: Decreased activity tolerance;Pain      OT Treatment/Interventions:      OT Goals(Current goals can be found in the care plan section) Acute Rehab OT Goals Patient Stated Goal: to go home OT Goal Formulation: With patient Time For Goal Achievement: 06/30/19 Potential to Achieve Goals: Good  OT Frequency:     Barriers to D/C:            Co-evaluation              AM-PAC OT "6 Clicks" Daily Activity     Outcome Measure Help from another person eating meals?: None Help from another person taking care of personal grooming?: None Help from another person toileting, which includes using toliet, bedpan, or urinal?: None Help  from another person bathing (including washing, rinsing, drying)?: A Little Help from another person to put on and taking off regular upper body clothing?: None Help from another person to put on and taking off regular lower body clothing?: A Little 6 Click Score: 22   End of Session Equipment Utilized During Treatment: Back brace;Rolling walker Nurse Communication: Mobility status  Activity Tolerance: Patient tolerated treatment well Patient left: in bed;with call bell/phone within reach  OT Visit Diagnosis: Unsteadiness on feet (R26.81);Muscle weakness (generalized) (M62.81)                Time: 1010-1047 OT Time Calculation (min): 37 min Charges:  OT General Charges $OT Visit: 1 Visit OT Evaluation $OT Eval Moderate Complexity: 1 Mod OT Treatments $Self Care/Home Management : 8-22 mins  Ebony Hail Harold Hedge) Marsa Aris OTR/L Acute Rehabilitation Services Pager: [phone] Office: [phone]   OINOMVE H MCNOB 06/16/2019, 10:59 AM

## 2019-06-16 NOTE — Discharge Instructions (Signed)

## 2019-06-16 NOTE — Evaluation (Signed)
Physical Therapy Evaluation and Discharge Patient Details Name: [name] MRN: [id number] DOB: 05/03/1951 Today's Date: 06/16/2019   History of Present Illness  Pt is a 68 yo male s/p L3-4 PLIF. PMHx: prior back sx.  Clinical Impression  Patient evaluated by Physical Therapy with no further acute PT needs identified. All education has been completed and the patient has no further questions. Pt was able to demonstrate transfers and ambulation with gross modified independence and progressed to no AD. Pt was educated on precautions, brace application/wearing schedule, appropriate activity progression, and car transfer. See below for any follow-up Physical Therapy or equipment needs. PT is signing off. Thank you for this referral.     Follow Up Recommendations No PT follow up;Supervision for mobility/OOB    Equipment Recommendations  None recommended by PT    Recommendations for Other Services       Precautions / Restrictions Precautions Precautions: Back Precaution Booklet Issued: Yes (comment) Precaution Comments: verbally discussed handout Required Braces or Orthoses: Spinal Brace Spinal Brace: Lumbar corset;Applied in standing position Restrictions Weight Bearing Restrictions: No      Mobility  Bed Mobility Overal bed mobility: Modified Independent Bed Mobility: Sidelying to Sit;Sit to Supine   Sidelying to sit: Supervision   Sit to supine: Supervision Sit to sidelying: Supervision General bed mobility comments: HOB flat and rails lowered to simulate home environment. Pt was able to perform without assistance and with good technique. Min cues for mild improvement of log roll  Transfers Overall transfer level: Modified independent Equipment used: Rolling walker (2 wheeled);None Transfers: Sit to/from Stand Sit to Stand: Supervision         General transfer comment: Pt demonstrated good hand placement on seated surface for safety. No assist required and no  unstadiness noted.  Ambulation/Gait Ambulation/Gait assistance: Modified independent (Device/Increase time) Gait Distance (Feet): 400 Feet Assistive device: Rolling walker (2 wheeled);None Gait Pattern/deviations: Step-through pattern;Decreased stride length Gait velocity: Decreased Gait velocity interpretation: 1.31 - 2.62 ft/sec, indicative of limited community ambulator General Gait Details: Initially with RW but pt was able to progress away from the walker and ambulate well without an AD.  Stairs Stairs: Yes Stairs assistance: Modified independent (Device/Increase time) Stair Management: One rail Left;Step to pattern;Forwards Number of Stairs: 10 General stair comments: VC's for sequencing and general safety.   Wheelchair Mobility    Modified Rankin (Stroke Patients Only)       Balance Overall balance assessment: Mild deficits observed, not formally tested                                           Pertinent Vitals/Pain Pain Assessment: No/denies pain    Home Living Family/patient expects to be discharged to:: Private residence Living Arrangements: Spouse/significant other Available Help at Discharge: Family;Available 24 hours/day(for 9 days then intermittently.) Type of Home: House Home Access: Stairs to enter Entrance Stairs-Rails: Right Entrance Stairs-Number of Steps: 3 Home Layout: Two level;1/2 bath on main level;Bed/bath upstairs Home Equipment: Walker - 2 wheels;Hand held shower head;Shower seat;Adaptive equipment;Grab bars - tub/shower;Grab bars - toilet      Prior Function Level of Independence: Independent with assistive device(s)         Comments: RW for mobility     Hand Dominance   Dominant Hand: Right    Extremity/Trunk Assessment   Upper Extremity Assessment Upper Extremity Assessment: Generalized weakness    Lower  Extremity Assessment Lower Extremity Assessment: Generalized weakness    Cervical / Trunk  Assessment Cervical / Trunk Assessment: Other exceptions Cervical / Trunk Exceptions: s/p lumbar sx  Communication   Communication: No difficulties  Cognition Arousal/Alertness: Awake/alert Behavior During Therapy: WFL for tasks assessed/performed Overall Cognitive Status: Within Functional Limits for tasks assessed                                        General Comments      Exercises     Assessment/Plan    PT Assessment Patent does not need any further PT services  PT Problem List         PT Treatment Interventions      PT Goals (Current goals can be found in the Care Plan section)  Acute Rehab PT Goals Patient Stated Goal: to go home PT Goal Formulation: All assessment and education complete, DC therapy    Frequency     Barriers to discharge        Co-evaluation               AM-PAC PT "6 Clicks" Mobility  Outcome Measure Help needed turning from your back to your side while in a flat bed without using bedrails?: None Help needed moving from lying on your back to sitting on the side of a flat bed without using bedrails?: None Help needed moving to and from a bed to a chair (including a wheelchair)?: None Help needed standing up from a chair using your arms (e.g., wheelchair or bedside chair)?: None Help needed to walk in hospital room?: None Help needed climbing 3-5 steps with a railing? : None 6 Click Score: 24    End of Session Equipment Utilized During Treatment: Gait belt;Back brace Activity Tolerance: Patient tolerated treatment well Patient left: with call bell/phone within reach(Sitting EOB awaiting d/c) Nurse Communication: Mobility status PT Visit Diagnosis: Unsteadiness on feet (R26.81);Pain Pain - part of body: (back/incision site)    Time: 1050-1110 PT Time Calculation (min) (ACUTE ONLY): 20 min   Charges:   PT Evaluation $PT Eval Low Complexity: 1 Low          [name], PT, DPT Acute Rehabilitation  Services Pager: [phone] Office: [phone]   [name] 06/16/2019, 1:32 PM

## 2019-06-16 NOTE — Plan of Care (Signed)
Patient alert and oriented, mae's well, voiding adequate amount of urine, swallowing without difficulty, no c/o pain at time of discharge. Patient discharged home with family. Script and discharged instructions given to patient. Patient and family stated understanding of instructions given. Patient has an appointment with Dr. Cabbell   

## 2019-06-16 NOTE — Discharge Summary (Signed)
Physician Discharge Summary  Patient ID: [name] MRN: [id number] DOB/AGE: 1951/04/12 68 y.o.  Admit date: 06/15/2019 Discharge date: 06/16/2019  Admission Diagnoses:Lumbar stenosis with neurogenic claudication, listhesis lumbar  Discharge Diagnoses:  Active Problems:   Lumbar stenosis with neurogenic claudication   Discharged Condition: good  Hospital Course: [name] was admitted and taken to the operaTing room for an uncomplicated fusion at J6/9. Post op he is voiding, ambulating, and tolerating a regular diet. His wound is clean, dry, and without sign of infection. He will be discharged home.  Treatments: surgery: Lumbar Three-Four Posterior lumbar interbody fusion 2. Laminectomy in excess of needed exposure for a PLIF 3.non segmental pedicle screw fixation Nuvasive L3/4 4 L5 pedicle screw removal   Discharge Exam: Blood pressure (!) 141/70, pulse 72, temperature (!) 97.5 F (36.4 C), temperature source Oral, resp. rate 16, height 5' 6.5" (1.689 m), weight 65.8 kg, SpO2 99 %. General appearance: alert, cooperative and appears stated age Neurologic: Alert and oriented X 3, normal strength and tone. Normal symmetric reflexes. Normal coordination and gait  Disposition:  Acquired spondylolisthesis  Allergies as of 06/16/2019   No Known Allergies     Medication List    STOP taking these medications   ibuprofen 200 MG tablet Commonly known as: ADVIL     TAKE these medications   aspirin EC 81 MG tablet Take 81 mg by mouth daily.   Fish Oil 1000 MG Caps Take 1,000 mg by mouth daily after breakfast.   losartan 100 MG tablet Commonly known as: COZAAR Take 100 mg by mouth daily.   oxyCODONE 5 MG immediate release tablet Commonly known as: Oxy IR/ROXICODONE Take 1 tablet (5 mg total) by mouth every 6 (six) hours as needed (severe pain.).   tiZANidine 4 MG tablet Commonly known as: ZANAFLEX Take 1 tablet (4 mg total) by mouth every 6 (six) hours as needed  for muscle spasms.      Follow-up Information    Ashok Pall, MD Follow up in 3 week(s).   Specialty: Neurosurgery Why: please call to make an appointment Contact information: [address] [place] [address] [phone]           Signed: Ashok Pall 06/16/2019, 11:17 AM

## 2019-10-08 ENCOUNTER — Ambulatory Visit: Payer: BLUE CROSS/BLUE SHIELD

## 2019-10-08 ENCOUNTER — Ambulatory Visit: Payer: Medicare Other | Attending: Internal Medicine

## 2019-10-08 DIAGNOSIS — Z23 Encounter for immunization: Secondary | ICD-10-CM

## 2019-10-08 NOTE — Progress Notes (Signed)
   Covid-19 Vaccination Clinic  Name:  [name]    MRN: [id number] DOB: 1951/02/24  10/08/2019  Mr. [name] was observed post Covid-19 immunization for 15 minutes without incidence. He was provided with Vaccine Information Sheet and instruction to access the V-Safe system.   Mr. [name] was instructed to call 911 with any severe reactions post vaccine: Marland Kitchen Difficulty breathing  . Swelling of your face and throat  . A fast heartbeat  . A bad rash all over your body  . Dizziness and weakness    Immunizations Administered    Name Date Dose VIS Date Route   Pfizer COVID-19 Vaccine 10/08/2019  2:21 PM 0.3 mL 08/04/2019 Intramuscular   Manufacturer: [employer], [employer]   Lot: [id number]   NDC: [address]-9

## 2019-10-31 ENCOUNTER — Ambulatory Visit: Payer: BLUE CROSS/BLUE SHIELD | Attending: Internal Medicine

## 2019-10-31 DIAGNOSIS — Z23 Encounter for immunization: Secondary | ICD-10-CM | POA: Insufficient documentation

## 2019-10-31 NOTE — Progress Notes (Signed)
   Covid-19 Vaccination Clinic  Name:  [name]    MRN: [id number] DOB: 01-28-1951  10/31/2019  [name] was observed post Covid-19 immunization for 15 minutes without incident. He was provided with Vaccine Information Sheet and instruction to access the V-Safe system.   [name] was instructed to call 911 with any severe reactions post vaccine: Marland Kitchen Difficulty breathing  . Swelling of face and throat  . A fast heartbeat  . A bad rash all over body  . Dizziness and weakness   Immunizations Administered    Name Date Dose VIS Date Route   Pfizer COVID-19 Vaccine 10/31/2019  8:31 AM 0.3 mL 08/04/2019 Intramuscular   Manufacturer: [employer], [employer]   Lot: [id number]   NDC: [address]-4

## 2019-11-01 ENCOUNTER — Ambulatory Visit: Payer: BLUE CROSS/BLUE SHIELD

## 2020-01-02 ENCOUNTER — Other Ambulatory Visit: Payer: Self-pay | Admitting: Family Medicine

## 2020-01-02 DIAGNOSIS — Z136 Encounter for screening for cardiovascular disorders: Secondary | ICD-10-CM

## 2020-01-03 ENCOUNTER — Other Ambulatory Visit: Payer: Self-pay | Admitting: Family Medicine

## 2020-01-03 DIAGNOSIS — Z0000 Encounter for general adult medical examination without abnormal findings: Secondary | ICD-10-CM

## 2020-01-03 DIAGNOSIS — Z136 Encounter for screening for cardiovascular disorders: Secondary | ICD-10-CM

## 2020-01-04 ENCOUNTER — Ambulatory Visit
Admission: RE | Admit: 2020-01-04 | Discharge: 2020-01-04 | Disposition: A | Discharge status: Home / Self Care | Payer: BLUE CROSS/BLUE SHIELD | Source: Ambulatory Visit | Attending: Family Medicine | Admitting: Family Medicine

## 2020-01-04 DIAGNOSIS — Z136 Encounter for screening for cardiovascular disorders: Secondary | ICD-10-CM

## 2020-01-04 DIAGNOSIS — Z0000 Encounter for general adult medical examination without abnormal findings: Secondary | ICD-10-CM

## 2020-06-17 ENCOUNTER — Other Ambulatory Visit: Payer: Self-pay

## 2020-06-17 DIAGNOSIS — R6889 Other general symptoms and signs: Secondary | ICD-10-CM

## 2020-06-18 ENCOUNTER — Telehealth: Payer: Self-pay | Admitting: Acute Care

## 2020-06-19 ENCOUNTER — Ambulatory Visit: Payer: Medicare Other | Admitting: Physician Assistant

## 2020-06-19 ENCOUNTER — Ambulatory Visit (HOSPITAL_COMMUNITY)
Admission: RE | Admit: 2020-06-19 | Discharge: 2020-06-19 | Disposition: A | Discharge status: Home / Self Care | Payer: Medicare Other | Source: Ambulatory Visit | Attending: Physician Assistant | Admitting: Physician Assistant

## 2020-06-19 ENCOUNTER — Other Ambulatory Visit: Payer: Self-pay

## 2020-06-19 VITALS — BP 151/90 | HR 84 | Temp 98.6°F | Resp 20 | Ht 65.5 in | Wt 145.4 lb

## 2020-06-19 DIAGNOSIS — R6889 Other general symptoms and signs: Secondary | ICD-10-CM | POA: Diagnosis not present

## 2020-06-19 DIAGNOSIS — R9439 Abnormal result of other cardiovascular function study: Secondary | ICD-10-CM

## 2020-06-19 DIAGNOSIS — R0989 Other specified symptoms and signs involving the circulatory and respiratory systems: Secondary | ICD-10-CM

## 2020-06-19 DIAGNOSIS — I739 Peripheral vascular disease, unspecified: Secondary | ICD-10-CM | POA: Diagnosis not present

## 2020-06-20 ENCOUNTER — Encounter: Payer: Self-pay | Admitting: Physician Assistant

## 2020-06-20 NOTE — Progress Notes (Signed)
Requested by:  Daisy Floro, MD [address] [place],  [place] [address]  Reason for consultation: Abnormal ABI with [facility] screening   History of Present Illness   [name] is a 69 y.o. (1950-10-29) male who presents for evaluation after having abnormal ABIs from a home insurance screening.  Patient denies traditional claudication symptoms.  He however has had numbness in his fingertips as well as his feet.  This does not concern him as he is learned to live with it however he is wondering if it is related to his circulation.  He also denies any rest pain or nonhealing wounds of his feet.  Surgical history significant for lumbar spine fusion by Dr. Franky Macho about 1 year ago.  Risk factors for atherosclerotic disease include history of smoking (patient quit 4 years ago), hypertension, family history of early CAD.  Patient is originally from [place] where he was in the [employer] for over 20 years.  He now resides with his wife in [place].  Past Medical History:  Diagnosis Date  . Hypertension     Past Surgical History:  Procedure Laterality Date  . BACK SURGERY  2014   Dr. Franky Macho  . TOE SURGERY     has had pin placed and removed    Social History   Socioeconomic History  . Marital status: Married    Spouse name: Not on file  . Number of children: Not on file  . Years of education: Not on file  . Highest education level: Not on file  Occupational History  . Not on file  Tobacco Use  . Smoking status: Former Smoker    Packs/day: 0.50    Years: 8.00    Pack years: 4.00    Types: Cigarettes  . Smokeless tobacco: Never Used  Vaping Use  . Vaping Use: Never used  Substance and Sexual Activity  . Alcohol use: Yes    Alcohol/week: 16.0 standard drinks    Types: 16 Cans of beer per week    Comment: less when football season is over  . Drug use: No  . Sexual activity: Not on file  Other Topics Concern  . Not on file  Social History Narrative  . Not on  file   Social Determinants of Health   Financial Resource Strain:   . Difficulty of Paying Living Expenses: Not on file  Food Insecurity:   . Worried About Programme researcher, broadcasting/film/video in the Last Year: Not on file  . Ran Out of Food in the Last Year: Not on file  Transportation Needs:   . Lack of Transportation (Medical): Not on file  . Lack of Transportation (Non-Medical): Not on file  Physical Activity:   . Days of Exercise per Week: Not on file  . Minutes of Exercise per Session: Not on file  Stress:   . Feeling of Stress : Not on file  Social Connections:   . Frequency of Communication with Friends and Family: Not on file  . Frequency of Social Gatherings with Friends and Family: Not on file  . Attends Religious Services: Not on file  . Active Member of Clubs or Organizations: Not on file  . Attends Banker Meetings: Not on file  . Marital Status: Not on file  Intimate Partner Violence:   . Fear of Current or Ex-Partner: Not on file  . Emotionally Abused: Not on file  . Physically Abused: Not on file  . Sexually Abused: Not on  file   History reviewed. No pertinent family history.  Current Outpatient Medications  Medication Sig Dispense Refill  . aspirin EC 81 MG tablet Take 81 mg by mouth daily.    Marland Kitchen losartan (COZAAR) 100 MG tablet Take 100 mg by mouth daily.    . Omega-3 Fatty Acids (FISH OIL) 1000 MG CAPS Take 1,000 mg by mouth daily after breakfast.    . oxyCODONE (OXY IR/ROXICODONE) 5 MG immediate release tablet Take 1 tablet (5 mg total) by mouth every 6 (six) hours as needed (severe pain.). 30 tablet 0  . tiZANidine (ZANAFLEX) 4 MG tablet Take 1 tablet (4 mg total) by mouth every 6 (six) hours as needed for muscle spasms. 60 tablet 0   No current facility-administered medications for this visit.    No Known Allergies  REVIEW OF SYSTEMS (negative unless checked):   Cardiac:  []  Chest pain or chest pressure? []  Shortness of breath upon activity? []   Shortness of breath when lying flat? []  Irregular heart rhythm?  Vascular:  []  Pain in calf, thigh, or hip brought on by walking? []  Pain in feet at night that wakes you up from your sleep? []  Blood clot in your veins? []  Leg swelling?  Pulmonary:  []  Oxygen at home? []  Productive cough? []  Wheezing?  Neurologic:  []  Sudden weakness in arms or legs? []  Sudden numbness in arms or legs? []  Sudden onset of difficult speaking or slurred speech? []  Temporary loss of vision in one eye? []  Problems with dizziness?  Gastrointestinal:  []  Blood in stool? []  Vomited blood?  Genitourinary:  []  Burning when urinating? []  Blood in urine?  Psychiatric:  []  Major depression  Hematologic:  []  Bleeding problems? []  Problems with blood clotting?  Dermatologic:  []  Rashes or ulcers?  Constitutional:  []  Fever or chills?  Ear/Nose/Throat:  []  Change in hearing? []  Nose bleeds? []  Sore throat?  Musculoskeletal:  []  Back pain? []  Joint pain? []  Muscle pain?   For VQI Use Only   PRE-ADM LIVING Home  AMB STATUS Ambulatory  CAD Sx None  PRIOR CHF None  STRESS TEST No   Physical Examination     Vitals:   06/19/20 1547  BP: (!) 151/90  Pulse: 84  Resp: 20  Temp: 98.6 F (37 C)  TempSrc: Temporal  SpO2: 99%  Weight: 145 lb 6.4 oz (66 kg)  Height: 5' 5.5" (1.664 m)   Body mass index is 23.83 kg/m.  General:  WDWN in NAD; vital signs documented above Gait: Not observed HENT: WNL, normocephalic Pulmonary: normal non-labored breathing Cardiac: regular HR Skin: without rashes Vascular Exam/Pulses:  Right Left  Radial 2+ (normal) 2+ (normal)  DP absent absent  PT absent absent   Extremities: without ischemic changes, without Gangrene , without cellulitis; without open wounds;  Musculoskeletal: no muscle wasting or atrophy  Neurologic: A&O X 3;  No focal weakness or paresthesias are detected Psychiatric:  The pt has Normal affect.  Non-Invasive Vascular  imaging   BLE ABI  ABI/TBIToday's ABIToday's TBIPrevious ABIPrevious TBI  +-------+-----------+-----------+------------+------------+  Right 0.82    0.65                  +-------+-----------+-----------+------------+------------+  Left  0.76    0.71                    Medical Decision Making   JARAY BOLIVER is a 69 y.o. male who presents for repeat ABI after abnormal screening study  Bilateral ABIs are 0.8 which demonstrates mild PAD  Patient currently does not have traditional claudication, rest pain, or nonhealing wounds of bilateral lower extremities  There is no indication for intervention currently and he is already on a daily aspirin  Numbness in fingers as well as toes is symmetric and unlikely related to circulation  Encouraged patient to remain active  Follow regularly with PCP for management of chronic medical conditions  We will not repeat ABIs; patient will call/return office if he develops claudication, rest pain, or nonhealing wounds of bilateral lower extremities   Emilie Rutter PA-C [facility] Office: [phone]  06/20/2020, 9:37 AM   Clinic MD: Edilia Bo

## 2020-06-21 NOTE — Telephone Encounter (Signed)
Spoke with pt regarding lung cancer screening program. Pt is not interested in participating at this time. Referral cancelled. Letter sent to Dr Tenny Craw to make him aware.

## 2021-01-17 DIAGNOSIS — Z0000 Encounter for general adult medical examination without abnormal findings: Secondary | ICD-10-CM | POA: Diagnosis not present

## 2021-01-23 DIAGNOSIS — E7800 Pure hypercholesterolemia, unspecified: Secondary | ICD-10-CM | POA: Diagnosis not present

## 2021-01-23 DIAGNOSIS — Z0000 Encounter for general adult medical examination without abnormal findings: Secondary | ICD-10-CM | POA: Diagnosis not present

## 2021-01-23 DIAGNOSIS — I700 Atherosclerosis of aorta: Secondary | ICD-10-CM | POA: Diagnosis not present

## 2021-01-23 DIAGNOSIS — Z23 Encounter for immunization: Secondary | ICD-10-CM | POA: Diagnosis not present

## 2021-01-23 DIAGNOSIS — I10 Essential (primary) hypertension: Secondary | ICD-10-CM | POA: Diagnosis not present

## 2021-01-23 DIAGNOSIS — I77811 Abdominal aortic ectasia: Secondary | ICD-10-CM | POA: Diagnosis not present

## 2021-01-23 DIAGNOSIS — J449 Chronic obstructive pulmonary disease, unspecified: Secondary | ICD-10-CM | POA: Diagnosis not present

## 2021-02-27 DIAGNOSIS — Z1211 Encounter for screening for malignant neoplasm of colon: Secondary | ICD-10-CM | POA: Diagnosis not present

## 2021-03-19 DIAGNOSIS — R195 Other fecal abnormalities: Secondary | ICD-10-CM | POA: Diagnosis not present

## 2021-08-01 IMAGING — RF DG LUMBAR SPINE 2-3V
1 series · 2 of 2 positions shown · non-contrast
Comparison: 05/24/2013

CLINICAL DATA: L3-L4 PLIF. Hx of back surgery(1859).

EXAM:
LUMBAR SPINE - 2-3 VIEW; DG C-ARM 1-60 MIN

[Series 1: run · 2 of 2 slices shown]
[im 1/2]
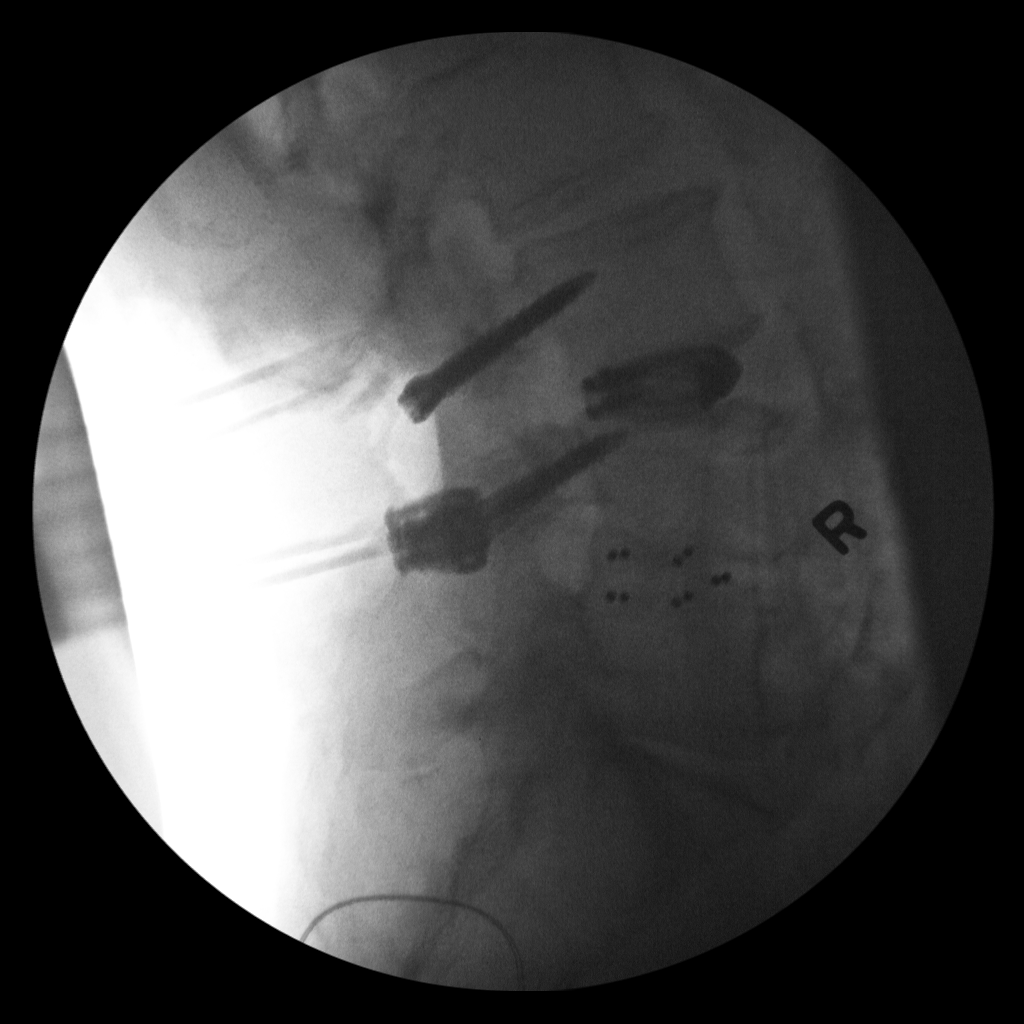
[im 2/2]
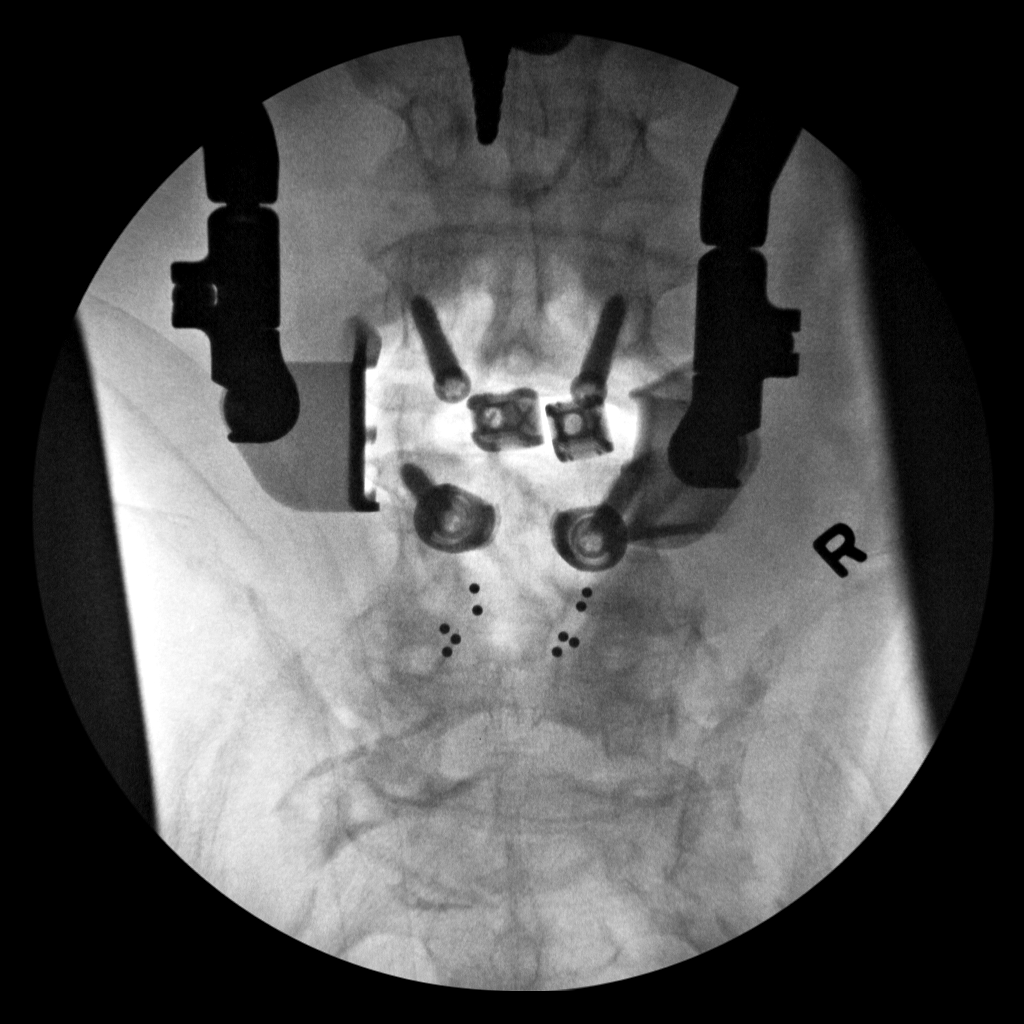

[2 of 2 positions shown; findings below may reference images not displayed]

FINDINGS: Pedicle screws at L5 have been removed. Interbody fusion at L4-5.
Transpedicular screws have been placed at L3. Interbody fusion
device has been placed at L3-4.
IMPRESSION: Postoperative changes.

## 2021-09-16 DIAGNOSIS — M7989 Other specified soft tissue disorders: Secondary | ICD-10-CM | POA: Diagnosis not present

## 2021-09-17 ENCOUNTER — Other Ambulatory Visit (HOSPITAL_COMMUNITY): Payer: Self-pay | Admitting: Family Medicine

## 2021-09-17 DIAGNOSIS — R7989 Other specified abnormal findings of blood chemistry: Secondary | ICD-10-CM

## 2021-09-17 DIAGNOSIS — M7989 Other specified soft tissue disorders: Secondary | ICD-10-CM

## 2021-09-18 ENCOUNTER — Ambulatory Visit (HOSPITAL_COMMUNITY)
Admission: RE | Admit: 2021-09-18 | Discharge: 2021-09-18 | Disposition: A | Discharge status: Home / Self Care | Payer: Medicare Other | Source: Ambulatory Visit | Attending: Cardiology | Admitting: Cardiology

## 2021-09-18 ENCOUNTER — Other Ambulatory Visit: Payer: Self-pay

## 2021-09-18 DIAGNOSIS — M7989 Other specified soft tissue disorders: Secondary | ICD-10-CM | POA: Diagnosis not present

## 2021-09-18 DIAGNOSIS — R7989 Other specified abnormal findings of blood chemistry: Secondary | ICD-10-CM | POA: Diagnosis not present

## 2021-11-12 DIAGNOSIS — U071 COVID-19: Secondary | ICD-10-CM | POA: Diagnosis not present

## 2021-11-12 DIAGNOSIS — J449 Chronic obstructive pulmonary disease, unspecified: Secondary | ICD-10-CM | POA: Diagnosis not present

## 2022-01-22 DIAGNOSIS — Z0000 Encounter for general adult medical examination without abnormal findings: Secondary | ICD-10-CM | POA: Diagnosis not present

## 2022-01-27 DIAGNOSIS — J449 Chronic obstructive pulmonary disease, unspecified: Secondary | ICD-10-CM | POA: Diagnosis not present

## 2022-01-27 DIAGNOSIS — I10 Essential (primary) hypertension: Secondary | ICD-10-CM | POA: Diagnosis not present

## 2022-01-27 DIAGNOSIS — I77811 Abdominal aortic ectasia: Secondary | ICD-10-CM | POA: Diagnosis not present

## 2022-01-27 DIAGNOSIS — R454 Irritability and anger: Secondary | ICD-10-CM | POA: Diagnosis not present

## 2022-01-27 DIAGNOSIS — Z0000 Encounter for general adult medical examination without abnormal findings: Secondary | ICD-10-CM | POA: Diagnosis not present

## 2022-01-27 DIAGNOSIS — E7800 Pure hypercholesterolemia, unspecified: Secondary | ICD-10-CM | POA: Diagnosis not present

## 2022-02-20 IMAGING — US US ABDOMINAL AORTA SCREENING AAA
1 series · 14 of 25 positions shown · non-contrast
Comparison: None.

CLINICAL DATA: Screening of the abdominal aorta. History of
smoking. Male at age 69.

EXAM:
US ABDOMINAL AORTA MEDICARE SCREENING
TECHNIQUE: Ultrasound examination of the abdominal aorta was performed as a
screening evaluation for abdominal aortic aneurysm.

[Series 1: us abdominal aorta screening aaa · 0.25mm/px · 14 of 25 slices shown]
[im 1/25]
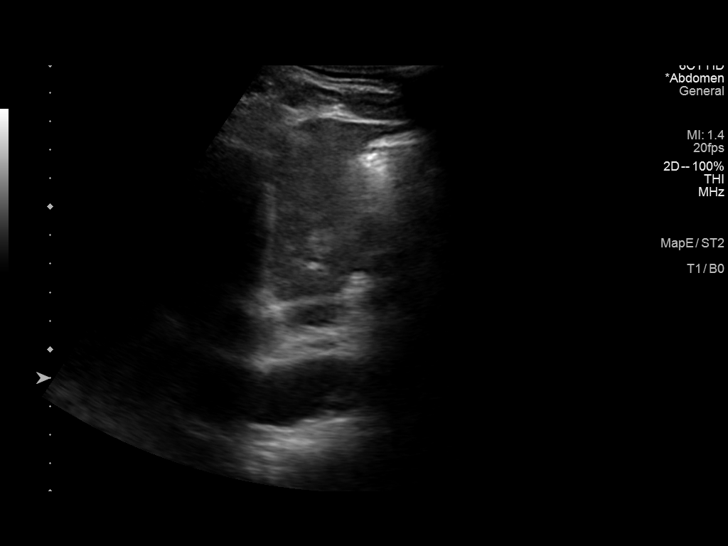
[im 3/25]
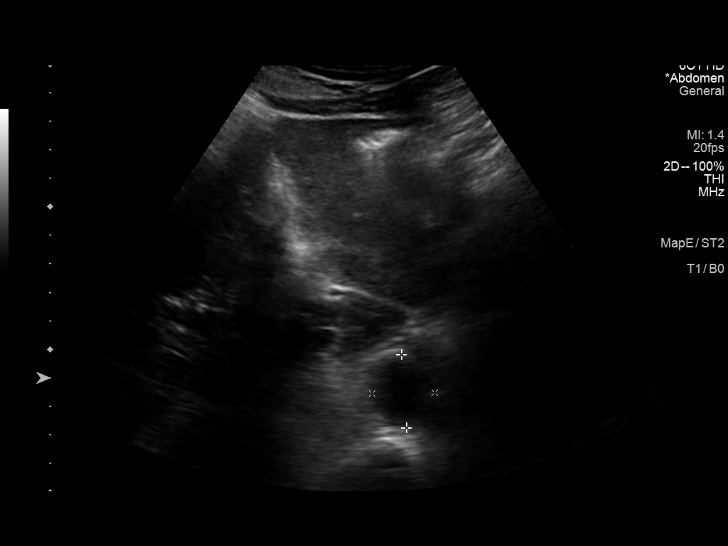
[im 5/25]
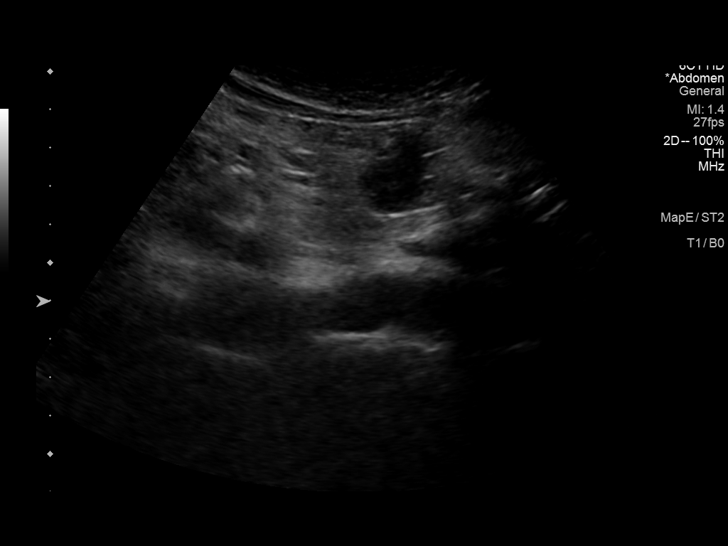
[im 7/25]
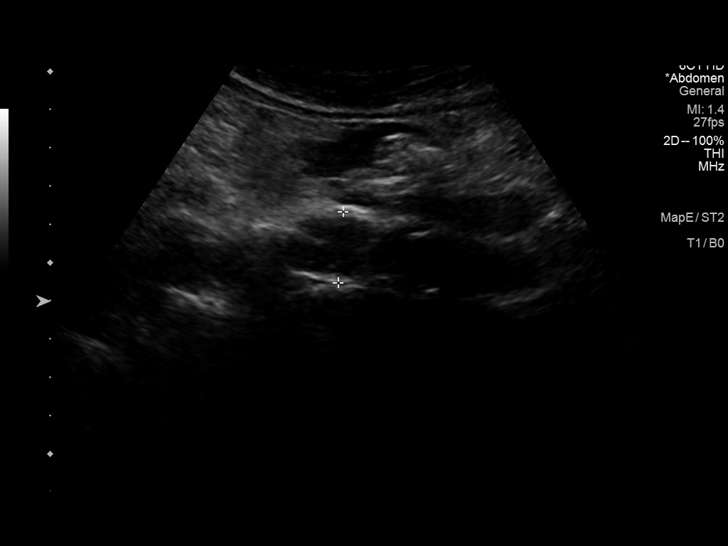
[im 9/25]
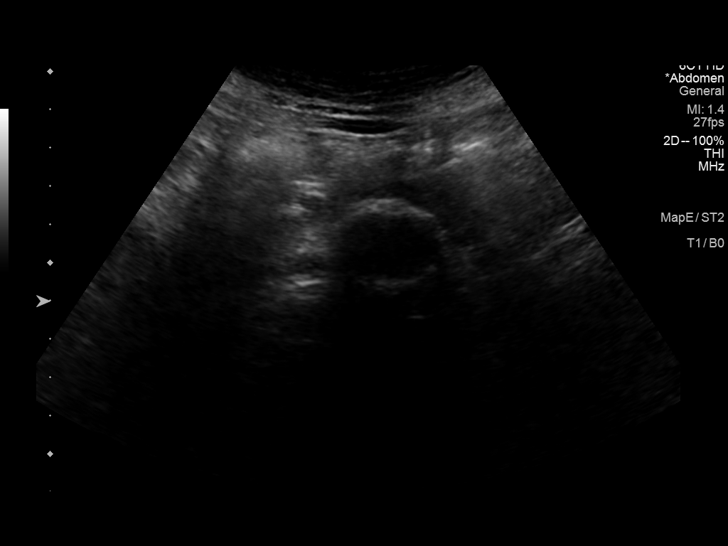
[im 10/25]
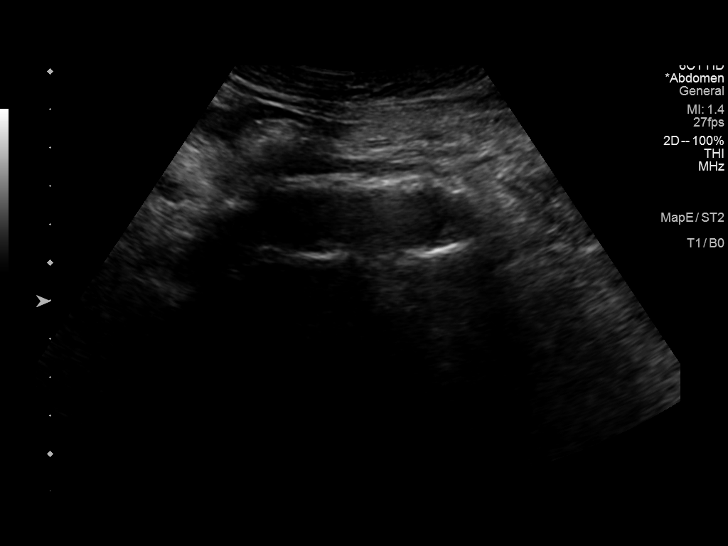
[im 12/25]
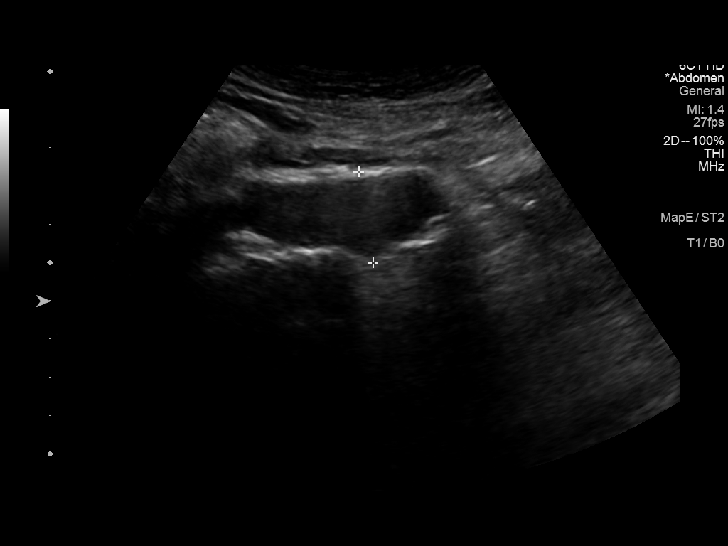
[im 14/25]
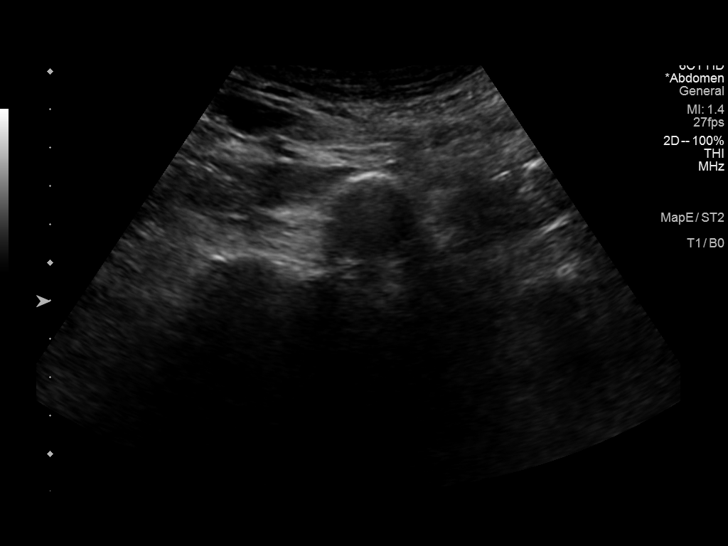
[im 16/25]
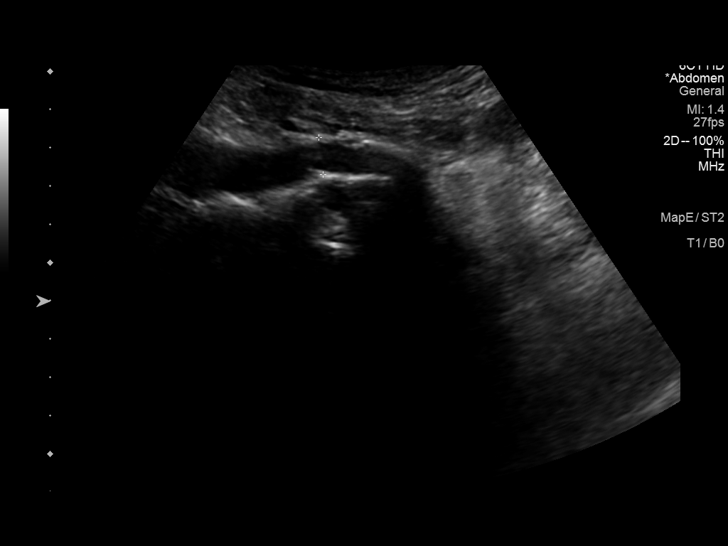
[im 17/25]
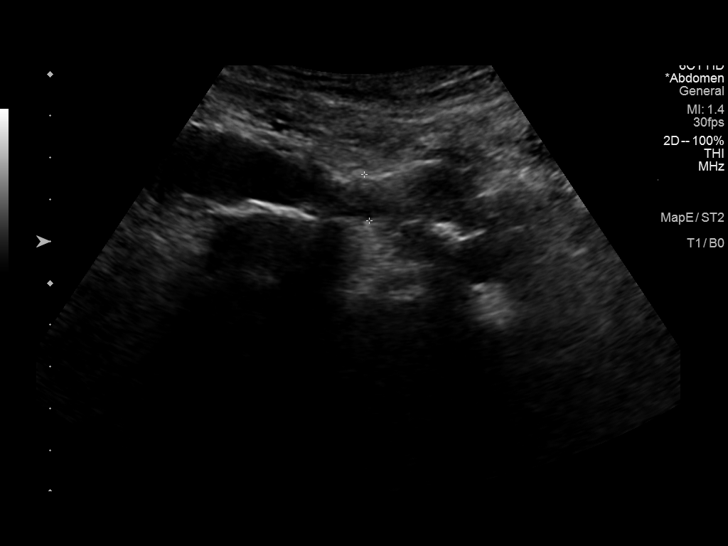
[im 19/25]
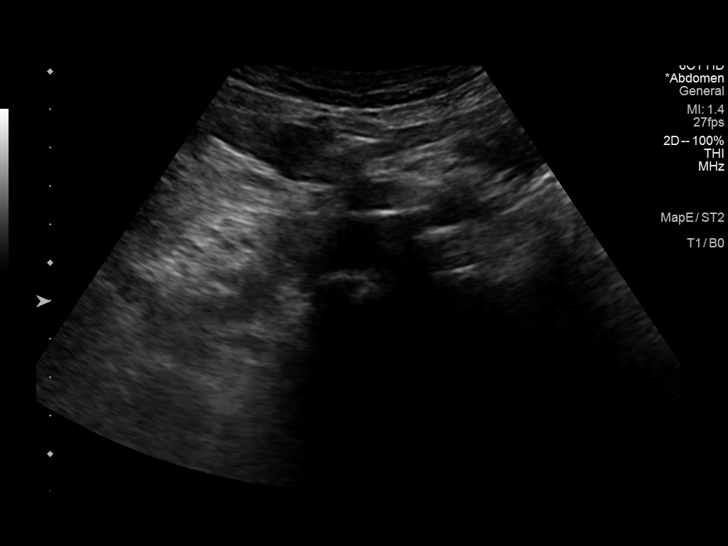
[im 21/25]
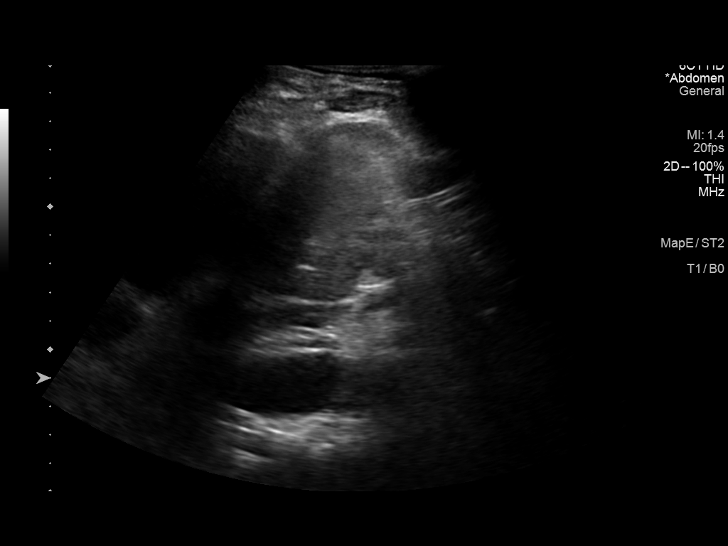
[im 23/25]
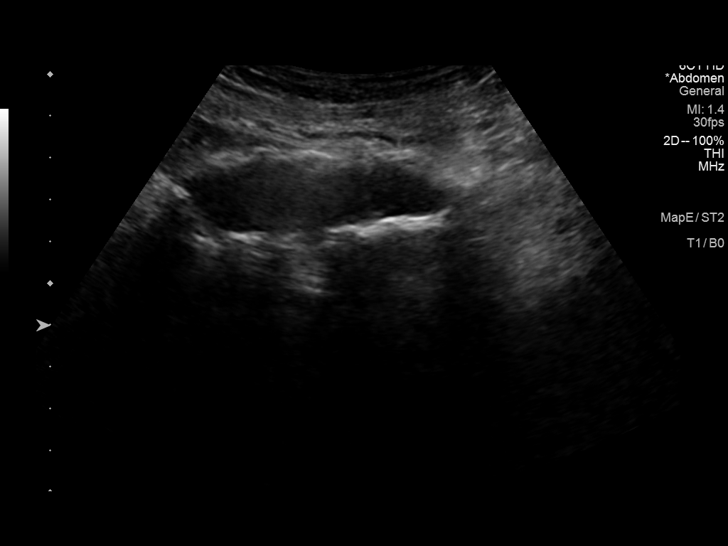
[im 25/25]
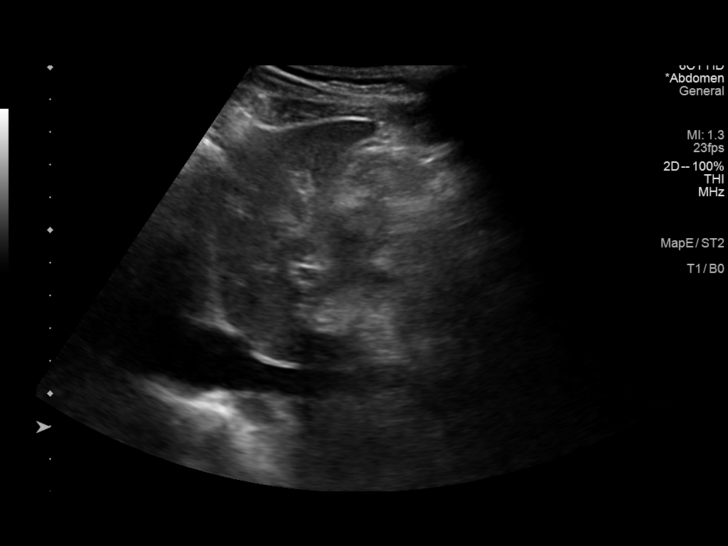

[14 of 25 positions shown; findings below may reference images not displayed]

FINDINGS: Abdominal aortic measurements as follows:

Proximal:  2.5 cm

Mid:  2.7 cm

Distal:  2.4 cm

Abdominal aorta is tortuous.  Atherosclerosis is present.
IMPRESSION: 1. Mid abdominal aorta 2.7 centimeters.
2. Ectatic abdominal aorta at risk for aneurysm development.
Recommend followup by ultrasound in 5 years. This recommendation
follows ACR consensus guidelines: White Paper of the ACR Incidental
[DATE]. Aortic aneurysm NOS (QTMGU-QPM.E)

## 2022-03-24 DIAGNOSIS — M48062 Spinal stenosis, lumbar region with neurogenic claudication: Secondary | ICD-10-CM | POA: Diagnosis not present

## 2022-03-24 DIAGNOSIS — M4317 Spondylolisthesis, lumbosacral region: Secondary | ICD-10-CM | POA: Diagnosis not present

## 2022-03-26 DIAGNOSIS — M4317 Spondylolisthesis, lumbosacral region: Secondary | ICD-10-CM | POA: Diagnosis not present

## 2022-03-26 DIAGNOSIS — M47817 Spondylosis without myelopathy or radiculopathy, lumbosacral region: Secondary | ICD-10-CM | POA: Diagnosis not present

## 2022-03-26 DIAGNOSIS — M5127 Other intervertebral disc displacement, lumbosacral region: Secondary | ICD-10-CM | POA: Diagnosis not present

## 2022-03-26 DIAGNOSIS — M4807 Spinal stenosis, lumbosacral region: Secondary | ICD-10-CM | POA: Diagnosis not present

## 2022-03-30 DIAGNOSIS — M4316 Spondylolisthesis, lumbar region: Secondary | ICD-10-CM | POA: Diagnosis not present

## 2022-03-30 DIAGNOSIS — M48062 Spinal stenosis, lumbar region with neurogenic claudication: Secondary | ICD-10-CM | POA: Diagnosis not present

## 2022-04-07 ENCOUNTER — Other Ambulatory Visit: Payer: Self-pay | Admitting: Neurosurgery

## 2022-05-08 NOTE — Pre-Procedure Instructions (Signed)
Surgical Instructions    Your procedure is scheduled on May 15, 2022.  Report to [facility] Main Entrance "A" at 7:00 A.M., then check in with the Admitting office.  Call this number if you have problems the morning of surgery:  [phone]   If you have any questions prior to your surgery date call [phone]: Open Monday-Friday 8am-4pm    Remember:  Do not eat after midnight the night before your surgery   You may drink clear liquids until 6:00 AM the morning of your surgery.   Clear liquids allowed are: Water, Non-Citrus Juices (without pulp), Carbonated Beverages, Clear Tea, Black Coffee Only (NO MILK, CREAM OR POWDERED CREAMER of any kind), and Gatorade.      Take these medicines the morning of surgery with A SIP OF WATER:  oxyCODONE (OXY IR/ROXICODONE) - may take as needed  tiZANidine (ZANAFLEX) - may take as needed    Follow your surgeon's instructions on when to stop Aspirin.  If no instructions were given by your surgeon then you will need to call the office to get those instructions.     As of today, STOP taking any Aleve, Naproxen, Ibuprofen, Motrin, Advil, Goody's, BC's, all herbal medications, fish oil, and all vitamins.                     Do NOT Smoke (Tobacco/Vaping) for 24 hours prior to your procedure.  If you use a CPAP at night, you may bring your mask/headgear for your overnight stay.   Contacts, glasses, piercing's, hearing aid's, dentures or partials may not be worn into surgery, please bring cases for these belongings.    For patients admitted to the hospital, discharge time will be determined by your treatment team.   Patients discharged the day of surgery will not be allowed to drive home, and someone needs to stay with them for 24 hours.  SURGICAL WAITING ROOM VISITATION Patients having surgery or a procedure may have no more than 2 support people in the waiting area - these visitors may rotate.   Children under the age of 73 must have  an adult with them who is not the patient. If the patient needs to stay at the hospital during part of their recovery, the visitor guidelines for inpatient rooms apply. Pre-op nurse will coordinate an appropriate time for 1 support person to accompany patient in pre-op.  This support person may not rotate.   Please refer to the [facility] website for the visitor guidelines for Inpatients (after your surgery is over and you are in a regular room).    Special instructions:   [place]- Preparing For Surgery  Before surgery, you can play an important role. Because skin is not sterile, your skin needs to be as free of germs as possible. You can reduce the number of germs on your skin by washing with CHG (chlorahexidine gluconate) Soap before surgery.  CHG is an antiseptic cleaner which kills germs and bonds with the skin to continue killing germs even after washing.    Oral Hygiene is also important to reduce your risk of infection.  Remember - BRUSH YOUR TEETH THE MORNING OF SURGERY WITH YOUR REGULAR TOOTHPASTE  Please do not use if you have an allergy to CHG or antibacterial soaps. If your skin becomes reddened/irritated stop using the CHG.  Do not shave (including legs and underarms) for at least 48 hours prior to first CHG shower. It is OK to shave your face.  Please  follow these instructions carefully.   Shower the NIGHT BEFORE SURGERY and the MORNING OF SURGERY  If you chose to wash your hair, wash your hair first as usual with your normal shampoo.  After you shampoo, rinse your hair and body thoroughly to remove the shampoo.  Use CHG Soap as you would any other liquid soap. You can apply CHG directly to the skin and wash gently with a scrungie or a clean washcloth.   Apply the CHG Soap to your body ONLY FROM THE NECK DOWN.  Do not use on open wounds or open sores. Avoid contact with your eyes, ears, mouth and genitals (private parts). Wash Face and genitals (private parts)  with your  normal soap.   Wash thoroughly, paying special attention to the area where your surgery will be performed.  Thoroughly rinse your body with warm water from the neck down.  DO NOT shower/wash with your normal soap after using and rinsing off the CHG Soap.  Pat yourself dry with a CLEAN TOWEL.  Wear CLEAN PAJAMAS to bed the night before surgery  Place CLEAN SHEETS on your bed the night before your surgery  DO NOT SLEEP WITH PETS.   Day of Surgery: Take a shower with CHG soap. Do not wear jewelry or makeup Do not wear lotions, powders, perfumes/colognes, or deodorant. Do not shave 48 hours prior to surgery.  Men may shave face and neck. Do not bring valuables to the hospital.  [facility] is not responsible for any belongings or valuables. Do not wear nail polish, gel polish, artificial nails, or any other type of covering on natural nails (fingers and toes) If you have artificial nails or gel coating that need to be removed by a nail salon, please have this removed prior to surgery. Artificial nails or gel coating may interfere with anesthesia's ability to adequately monitor your vital signs.  Wear Clean/Comfortable clothing the morning of surgery Remember to brush your teeth WITH YOUR REGULAR TOOTHPASTE.   Please read over the following fact sheets that you were given.    If you received a COVID test during your pre-op visit  it is requested that you wear a mask when out in public, stay away from anyone that may not be feeling well and notify your surgeon if you develop symptoms. If you have been in contact with anyone that has tested positive in the last 10 days please notify you surgeon.

## 2022-05-11 ENCOUNTER — Other Ambulatory Visit: Payer: Self-pay

## 2022-05-11 ENCOUNTER — Encounter (HOSPITAL_COMMUNITY): Payer: Self-pay

## 2022-05-11 ENCOUNTER — Encounter (HOSPITAL_COMMUNITY)
Admission: RE | Admit: 2022-05-11 | Discharge: 2022-05-11 | Disposition: A | Discharge status: Home / Self Care | Payer: Medicare Other | Source: Ambulatory Visit | Attending: Neurosurgery | Admitting: Neurosurgery

## 2022-05-11 VITALS — BP 142/75 | HR 96 | Temp 98.3°F | Resp 17 | Ht 67.0 in | Wt 141.0 lb

## 2022-05-11 DIAGNOSIS — I2510 Atherosclerotic heart disease of native coronary artery without angina pectoris: Secondary | ICD-10-CM | POA: Diagnosis not present

## 2022-05-11 DIAGNOSIS — Z01818 Encounter for other preprocedural examination: Secondary | ICD-10-CM | POA: Insufficient documentation

## 2022-05-11 LAB — CBC
HCT: 46.9 % (ref 39.0–52.0)
Hemoglobin: 16.6 g/dL (ref 13.0–17.0)
MCH: 33.9 pg (ref 26.0–34.0)
MCHC: 35.4 g/dL (ref 30.0–36.0)
MCV: 95.9 fL (ref 80.0–100.0)
Platelets: 277 10*3/uL (ref 150–400)
RBC: 4.89 MIL/uL (ref 4.22–5.81)
RDW: 12.8 % (ref 11.5–15.5)
WBC: 11.4 10*3/uL — ABNORMAL HIGH (ref 4.0–10.5)
nRBC: 0 % (ref 0.0–0.2)

## 2022-05-11 LAB — BASIC METABOLIC PANEL
Anion gap: 9 (ref 5–15)
BUN: 7 mg/dL — ABNORMAL LOW (ref 8–23)
CO2: 25 mmol/L (ref 22–32)
Calcium: 9.6 mg/dL (ref 8.9–10.3)
Chloride: 104 mmol/L (ref 98–111)
Creatinine, Ser: 0.81 mg/dL (ref 0.61–1.24)
GFR, Estimated: >60 mL/min (ref >60)
Glucose, Bld: 111 mg/dL — ABNORMAL HIGH (ref 70–99)
Potassium: 4.8 mmol/L (ref 3.5–5.1)
Sodium: 138 mmol/L (ref 135–145)

## 2022-05-11 LAB — TYPE AND SCREEN
ABO/RH(D): B POS
Antibody Screen: NEGATIVE

## 2022-05-11 LAB — SURGICAL PCR SCREEN
MRSA, PCR: NEGATIVE
Staphylococcus aureus: NEGATIVE

## 2022-05-11 NOTE — Progress Notes (Addendum)
PCP - Dr. Melinda Crutch Cardiologist - Denies  PPM/ICD - Denies Device Orders - n/a Rep Notified - n/a  Chest x-ray - n/a EKG - 05/11/2022 Stress Test - Denies ECHO - Denies Cardiac Cath - Denies  Sleep Study - Denies CPAP - n/a  No DM  Blood Thinner Instructions: n/a Aspirin Instructions: Per surgeon's instructions, pts last dose of ASA was this morning.  ERAS Protcol - Yes. Clear liquids until 0600 morning of surgery PRE-SURGERY Ensure or G2- none ordered  COVID TEST- n/a   Anesthesia review: Yes. Abnormal EKG.  Patient denies shortness of breath, fever, cough and chest pain at PAT appointment   All instructions explained to the patient, with a verbal understanding of the material. Patient agrees to go over the instructions while at home for a better understanding. Patient also instructed to self quarantine after being tested for COVID-19. The opportunity to ask questions was provided.

## 2022-05-14 ENCOUNTER — Encounter (HOSPITAL_COMMUNITY): Payer: Self-pay | Admitting: Neurosurgery

## 2022-05-14 NOTE — Anesthesia Preprocedure Evaluation (Addendum)
Anesthesia Evaluation  Patient identified by MRN, date of birth, ID band Patient awake    Reviewed: Allergy & Precautions, NPO status , Patient's Chart, lab work & pertinent test results, reviewed documented beta blocker date and time   Airway Mallampati: II  TM Distance: >3 FB Neck ROM: Full    Dental  (+) Edentulous Upper, Edentulous Lower   Pulmonary former smoker,    Pulmonary exam normal breath sounds clear to auscultation       Cardiovascular hypertension, Pt. on medications Normal cardiovascular exam Rhythm:Regular Rate:Normal  EKG 05/11/22 NSR with PVC's, RBBB pattern   Neuro/Psych Depression  Neuromuscular disease    GI/Hepatic negative GI ROS, Neg liver ROS,   Endo/Other  negative endocrine ROS  Renal/GU negative Renal ROS  negative genitourinary   Musculoskeletal Spondylolisthesis Lumbar with neurogenic claudication   Abdominal   Peds  Hematology negative hematology ROS (+)   Anesthesia Other Findings   Reproductive/Obstetrics                            Anesthesia Physical Anesthesia Plan  ASA: 2  Anesthesia Plan: General   Post-op Pain Management: Dilaudid IV and Precedex   Induction:   PONV Risk Score and Plan: 3 and Treatment may vary due to age or medical condition, Ondansetron and Dexamethasone  Airway Management Planned: Oral ETT  Additional Equipment: None  Intra-op Plan:   Post-operative Plan: Extubation in OR  Informed Consent: I have reviewed the patients History and Physical, chart, labs and discussed the procedure including the risks, benefits and alternatives for the proposed anesthesia with the patient or authorized representative who has indicated his/her understanding and acceptance.     Dental advisory given  Plan Discussed with: CRNA and Anesthesiologist  Anesthesia Plan Comments:        Anesthesia Quick Evaluation

## 2022-05-15 ENCOUNTER — Ambulatory Visit (HOSPITAL_COMMUNITY): Payer: Medicare Other

## 2022-05-15 ENCOUNTER — Ambulatory Visit (HOSPITAL_BASED_OUTPATIENT_CLINIC_OR_DEPARTMENT_OTHER): Payer: Medicare Other | Admitting: Physician Assistant

## 2022-05-15 ENCOUNTER — Encounter (HOSPITAL_COMMUNITY): Payer: Self-pay | Admitting: Neurosurgery

## 2022-05-15 ENCOUNTER — Encounter (HOSPITAL_COMMUNITY)
Admission: RE | Disposition: A | Discharge status: Home / Self Care | Payer: Self-pay | Source: Ambulatory Visit | Attending: Neurosurgery

## 2022-05-15 ENCOUNTER — Ambulatory Visit (HOSPITAL_COMMUNITY): Payer: Medicare Other | Admitting: Physician Assistant

## 2022-05-15 ENCOUNTER — Observation Stay (HOSPITAL_COMMUNITY)
Admission: RE | Admit: 2022-05-15 | Discharge: 2022-05-16 | Disposition: A | Discharge status: Home / Self Care | Payer: Medicare Other | Source: Ambulatory Visit | Attending: Neurosurgery | Admitting: Neurosurgery

## 2022-05-15 ENCOUNTER — Other Ambulatory Visit: Payer: Self-pay

## 2022-05-15 DIAGNOSIS — I10 Essential (primary) hypertension: Secondary | ICD-10-CM | POA: Diagnosis not present

## 2022-05-15 DIAGNOSIS — Z87891 Personal history of nicotine dependence: Secondary | ICD-10-CM | POA: Diagnosis not present

## 2022-05-15 DIAGNOSIS — M48062 Spinal stenosis, lumbar region with neurogenic claudication: Secondary | ICD-10-CM | POA: Diagnosis not present

## 2022-05-15 DIAGNOSIS — Z79899 Other long term (current) drug therapy: Secondary | ICD-10-CM | POA: Diagnosis not present

## 2022-05-15 DIAGNOSIS — M4316 Spondylolisthesis, lumbar region: Principal | ICD-10-CM | POA: Insufficient documentation

## 2022-05-15 DIAGNOSIS — Z7982 Long term (current) use of aspirin: Secondary | ICD-10-CM | POA: Diagnosis not present

## 2022-05-15 DIAGNOSIS — M4326 Fusion of spine, lumbar region: Secondary | ICD-10-CM | POA: Diagnosis not present

## 2022-05-15 DIAGNOSIS — Z981 Arthrodesis status: Secondary | ICD-10-CM | POA: Diagnosis not present

## 2022-05-15 LAB — URINALYSIS, COMPLETE (UACMP) WITH MICROSCOPIC
Bacteria, UA: NONE SEEN
Bilirubin Urine: NEGATIVE
Glucose, UA: NEGATIVE mg/dL
Hgb urine dipstick: NEGATIVE
Ketones, ur: NEGATIVE mg/dL
Leukocytes,Ua: NEGATIVE
Nitrite: NEGATIVE
Protein, ur: NEGATIVE mg/dL
Specific Gravity, Urine: 1.015 (ref 1.005–1.030)
pH: 7 (ref 5.0–8.0)

## 2022-05-15 LAB — CBC
HCT: 42.7 % (ref 39.0–52.0)
Hemoglobin: 15.3 g/dL (ref 13.0–17.0)
MCH: 34.3 pg — ABNORMAL HIGH (ref 26.0–34.0)
MCHC: 35.8 g/dL (ref 30.0–36.0)
MCV: 95.7 fL (ref 80.0–100.0)
Platelets: 252 10*3/uL (ref 150–400)
RBC: 4.46 MIL/uL (ref 4.22–5.81)
RDW: 12.6 % (ref 11.5–15.5)
WBC: 16.8 10*3/uL — ABNORMAL HIGH (ref 4.0–10.5)
nRBC: 0 % (ref 0.0–0.2)

## 2022-05-15 LAB — CREATININE, SERUM
Creatinine, Ser: 0.87 mg/dL (ref 0.61–1.24)
GFR, Estimated: >60 mL/min (ref >60)

## 2022-05-15 SURGERY — POSTERIOR LUMBAR FUSION 1 LEVEL
Anesthesia: General

## 2022-05-15 MED ORDER — OXYCODONE HCL 5 MG PO TABS
10.0000 mg | ORAL_TABLET | ORAL | Status: DC | PRN
  Administered 2022-05-16: 10 mg via ORAL
  Filled 2022-05-15: qty 2

## 2022-05-15 MED ORDER — LIDOCAINE-EPINEPHRINE 0.5 %-1:200000 IJ SOLN
INTRAMUSCULAR | Status: AC
  Filled 2022-05-15: qty 1

## 2022-05-15 MED ORDER — BUPIVACAINE HCL (PF) 0.5 % IJ SOLN
INTRAMUSCULAR | Status: AC
  Filled 2022-05-15: qty 30

## 2022-05-15 MED ORDER — DIAZEPAM 5 MG PO TABS: 5.0000 mg | ORAL_TABLET | Freq: Four times a day (QID) | ORAL | Status: DC | PRN

## 2022-05-15 MED ORDER — CEFAZOLIN SODIUM-DEXTROSE 2-4 GM/100ML-% IV SOLN
2.0000 g | INTRAVENOUS | Status: AC
  Administered 2022-05-15: 2 g via INTRAVENOUS
  Filled 2022-05-15: qty 100

## 2022-05-15 MED ORDER — PHENOL 1.4 % MT LIQD: 1.0000 | OROMUCOSAL | Status: DC | PRN

## 2022-05-15 MED ORDER — FENTANYL CITRATE (PF) 250 MCG/5ML IJ SOLN
INTRAMUSCULAR | Status: AC
  Filled 2022-05-15: qty 5

## 2022-05-15 MED ORDER — ALBUMIN HUMAN 5 % IV SOLN: INTRAVENOUS | Status: DC | PRN

## 2022-05-15 MED ORDER — SODIUM CHLORIDE (PF) 0.9 % IJ SOLN
INTRAMUSCULAR | Status: AC
  Filled 2022-05-15: qty 10

## 2022-05-15 MED ORDER — MENTHOL 3 MG MT LOZG: 1.0000 | LOZENGE | OROMUCOSAL | Status: DC | PRN

## 2022-05-15 MED ORDER — THROMBIN 20000 UNITS EX SOLR
CUTANEOUS | Status: AC
  Filled 2022-05-15: qty 20000

## 2022-05-15 MED ORDER — HYDROMORPHONE HCL 1 MG/ML IJ SOLN: 0.2500 mg | INTRAMUSCULAR | Status: DC | PRN

## 2022-05-15 MED ORDER — FENTANYL CITRATE (PF) 250 MCG/5ML IJ SOLN
INTRAMUSCULAR | Status: DC | PRN
  Administered 2022-05-15 (×5): 50 ug via INTRAVENOUS

## 2022-05-15 MED ORDER — HYDROMORPHONE HCL 1 MG/ML IJ SOLN
INTRAMUSCULAR | Status: AC
  Filled 2022-05-15: qty 0.5

## 2022-05-15 MED ORDER — LIDOCAINE 2% (20 MG/ML) 5 ML SYRINGE
INTRAMUSCULAR | Status: DC | PRN
  Administered 2022-05-15: 60 mg via INTRAVENOUS

## 2022-05-15 MED ORDER — SODIUM CHLORIDE 0.9% FLUSH: 3.0000 mL | INTRAVENOUS | Status: DC | PRN

## 2022-05-15 MED ORDER — IRBESARTAN 150 MG PO TABS
300.0000 mg | ORAL_TABLET | Freq: Every day | ORAL | Status: DC
  Administered 2022-05-16: 300 mg via ORAL
  Filled 2022-05-15: qty 2

## 2022-05-15 MED ORDER — BUPIVACAINE HCL (PF) 0.5 % IJ SOLN
INTRAMUSCULAR | Status: DC | PRN
  Administered 2022-05-15: 30 mL

## 2022-05-15 MED ORDER — PROPOFOL 10 MG/ML IV BOLUS
INTRAVENOUS | Status: AC
  Filled 2022-05-15: qty 20

## 2022-05-15 MED ORDER — ONDANSETRON HCL 4 MG PO TABS: 4.0000 mg | ORAL_TABLET | Freq: Four times a day (QID) | ORAL | Status: DC | PRN

## 2022-05-15 MED ORDER — PROPOFOL 10 MG/ML IV BOLUS
INTRAVENOUS | Status: DC | PRN
  Administered 2022-05-15: 120 mg via INTRAVENOUS

## 2022-05-15 MED ORDER — CHLORHEXIDINE GLUCONATE 0.12 % MT SOLN
15.0000 mL | Freq: Once | OROMUCOSAL | Status: AC
  Administered 2022-05-15: 15 mL via OROMUCOSAL
  Filled 2022-05-15: qty 15

## 2022-05-15 MED ORDER — OXYCODONE HCL 5 MG/5ML PO SOLN: 5.0000 mg | Freq: Once | ORAL | Status: DC | PRN

## 2022-05-15 MED ORDER — PHENYLEPHRINE HCL-NACL 20-0.9 MG/250ML-% IV SOLN
INTRAVENOUS | Status: DC | PRN
  Administered 2022-05-15: 25 ug/min via INTRAVENOUS

## 2022-05-15 MED ORDER — ORAL CARE MOUTH RINSE: 15.0000 mL | Freq: Once | OROMUCOSAL | Status: AC

## 2022-05-15 MED ORDER — ASPIRIN 81 MG PO TBEC
81.0000 mg | DELAYED_RELEASE_TABLET | Freq: Every day | ORAL | Status: DC
  Administered 2022-05-16: 81 mg via ORAL
  Filled 2022-05-15: qty 1

## 2022-05-15 MED ORDER — CEFAZOLIN SODIUM 1 G IJ SOLR
INTRAMUSCULAR | Status: AC
  Filled 2022-05-15: qty 20

## 2022-05-15 MED ORDER — HYDROCODONE-ACETAMINOPHEN 7.5-325 MG PO TABS
1.0000 | ORAL_TABLET | Freq: Four times a day (QID) | ORAL | Status: DC
  Administered 2022-05-15 – 2022-05-16 (×3): 1 via ORAL
  Filled 2022-05-15 (×3): qty 1

## 2022-05-15 MED ORDER — PHENYLEPHRINE 80 MCG/ML (10ML) SYRINGE FOR IV PUSH (FOR BLOOD PRESSURE SUPPORT)
PREFILLED_SYRINGE | INTRAVENOUS | Status: DC | PRN
  Administered 2022-05-15 (×4): 40 ug via INTRAVENOUS

## 2022-05-15 MED ORDER — ROCURONIUM BROMIDE 10 MG/ML (PF) SYRINGE
PREFILLED_SYRINGE | INTRAVENOUS | Status: DC | PRN
  Administered 2022-05-15: 20 mg via INTRAVENOUS
  Administered 2022-05-15: 50 mg via INTRAVENOUS
  Administered 2022-05-15: 30 mg via INTRAVENOUS

## 2022-05-15 MED ORDER — HEPARIN SODIUM (PORCINE) 5000 UNIT/ML IJ SOLN
5000.0000 [IU] | Freq: Three times a day (TID) | INTRAMUSCULAR | Status: DC
  Administered 2022-05-15 – 2022-05-16 (×2): 5000 [IU] via SUBCUTANEOUS
  Filled 2022-05-15 (×3): qty 1

## 2022-05-15 MED ORDER — ONDANSETRON HCL 4 MG/2ML IJ SOLN: 4.0000 mg | Freq: Once | INTRAMUSCULAR | Status: DC | PRN

## 2022-05-15 MED ORDER — OXYCODONE HCL 5 MG PO TABS: 5.0000 mg | ORAL_TABLET | ORAL | Status: DC | PRN

## 2022-05-15 MED ORDER — PHENYLEPHRINE 80 MCG/ML (10ML) SYRINGE FOR IV PUSH (FOR BLOOD PRESSURE SUPPORT)
PREFILLED_SYRINGE | INTRAVENOUS | Status: AC
  Filled 2022-05-15: qty 10

## 2022-05-15 MED ORDER — ROCURONIUM BROMIDE 10 MG/ML (PF) SYRINGE
PREFILLED_SYRINGE | INTRAVENOUS | Status: AC
  Filled 2022-05-15: qty 10

## 2022-05-15 MED ORDER — ONDANSETRON HCL 4 MG/2ML IJ SOLN: 4.0000 mg | Freq: Four times a day (QID) | INTRAMUSCULAR | Status: DC | PRN

## 2022-05-15 MED ORDER — DEXMEDETOMIDINE HCL IN NACL 80 MCG/20ML IV SOLN
INTRAVENOUS | Status: DC | PRN
  Administered 2022-05-15 (×5): 4 ug via BUCCAL

## 2022-05-15 MED ORDER — OXYCODONE HCL 5 MG PO TABS: 5.0000 mg | ORAL_TABLET | Freq: Once | ORAL | Status: DC | PRN

## 2022-05-15 MED ORDER — SUGAMMADEX SODIUM 200 MG/2ML IV SOLN
INTRAVENOUS | Status: DC | PRN
  Administered 2022-05-15: 140 mg via INTRAVENOUS

## 2022-05-15 MED ORDER — CEFAZOLIN SODIUM-DEXTROSE 1-4 GM/50ML-% IV SOLN
INTRAVENOUS | Status: DC | PRN
  Administered 2022-05-15: 2 g via INTRAVENOUS

## 2022-05-15 MED ORDER — DEXAMETHASONE SODIUM PHOSPHATE 10 MG/ML IJ SOLN
INTRAMUSCULAR | Status: DC | PRN
  Administered 2022-05-15: 10 mg via INTRAVENOUS

## 2022-05-15 MED ORDER — LIDOCAINE 2% (20 MG/ML) 5 ML SYRINGE
INTRAMUSCULAR | Status: AC
  Filled 2022-05-15: qty 5

## 2022-05-15 MED ORDER — LIDOCAINE-EPINEPHRINE 0.5 %-1:200000 IJ SOLN
INTRAMUSCULAR | Status: DC | PRN
  Administered 2022-05-15: 3 mL

## 2022-05-15 MED ORDER — CELECOXIB 200 MG PO CAPS
200.0000 mg | ORAL_CAPSULE | Freq: Two times a day (BID) | ORAL | Status: DC
  Administered 2022-05-15 – 2022-05-16 (×2): 200 mg via ORAL
  Filled 2022-05-15 (×2): qty 1

## 2022-05-15 MED ORDER — AMLODIPINE BESYLATE 5 MG PO TABS
5.0000 mg | ORAL_TABLET | Freq: Every day | ORAL | Status: DC
  Administered 2022-05-16: 5 mg via ORAL
  Filled 2022-05-15: qty 1

## 2022-05-15 MED ORDER — DEXMEDETOMIDINE HCL IN NACL 80 MCG/20ML IV SOLN
INTRAVENOUS | Status: AC
  Filled 2022-05-15: qty 20

## 2022-05-15 MED ORDER — POTASSIUM CHLORIDE IN NACL 20-0.9 MEQ/L-% IV SOLN: INTRAVENOUS | Status: DC

## 2022-05-15 MED ORDER — THROMBIN 20000 UNITS EX SOLR
CUTANEOUS | Status: DC | PRN
  Administered 2022-05-15: 20 mL via TOPICAL

## 2022-05-15 MED ORDER — ACETAMINOPHEN 650 MG RE SUPP: 650.0000 mg | RECTAL | Status: DC | PRN

## 2022-05-15 MED ORDER — SODIUM CHLORIDE 0.9% FLUSH
3.0000 mL | Freq: Two times a day (BID) | INTRAVENOUS | Status: DC
  Administered 2022-05-15: 3 mL via INTRAVENOUS

## 2022-05-15 MED ORDER — ONDANSETRON HCL 4 MG/2ML IJ SOLN
INTRAMUSCULAR | Status: DC | PRN
  Administered 2022-05-15: 4 mg via INTRAVENOUS

## 2022-05-15 MED ORDER — SODIUM CHLORIDE 0.9 % IV SOLN: 250.0000 mL | INTRAVENOUS | Status: DC

## 2022-05-15 MED ORDER — ONDANSETRON HCL 4 MG/2ML IJ SOLN
INTRAMUSCULAR | Status: AC
  Filled 2022-05-15: qty 2

## 2022-05-15 MED ORDER — CHLORHEXIDINE GLUCONATE CLOTH 2 % EX PADS: 6.0000 | MEDICATED_PAD | Freq: Once | CUTANEOUS | Status: DC

## 2022-05-15 MED ORDER — LACTATED RINGERS IV SOLN: INTRAVENOUS | Status: DC

## 2022-05-15 MED ORDER — 0.9 % SODIUM CHLORIDE (POUR BTL) OPTIME
TOPICAL | Status: DC | PRN
  Administered 2022-05-15: 1000 mL

## 2022-05-15 MED ORDER — HYDROMORPHONE HCL 1 MG/ML IJ SOLN
INTRAMUSCULAR | Status: DC | PRN
  Administered 2022-05-15: .5 mg via INTRAVENOUS

## 2022-05-15 MED ORDER — ACETAMINOPHEN 325 MG PO TABS: 650.0000 mg | ORAL_TABLET | ORAL | Status: DC | PRN

## 2022-05-15 MED ORDER — DEXAMETHASONE SODIUM PHOSPHATE 10 MG/ML IJ SOLN
INTRAMUSCULAR | Status: AC
  Filled 2022-05-15: qty 1

## 2022-05-15 MED ORDER — ESCITALOPRAM OXALATE 10 MG PO TABS
5.0000 mg | ORAL_TABLET | Freq: Every day | ORAL | Status: DC
  Administered 2022-05-16: 5 mg via ORAL
  Filled 2022-05-15: qty 1

## 2022-05-15 SURGICAL SUPPLY — 77 items
BAG COUNTER SPONGE SURGICOUNT (BAG) ×1 IMPLANT
BASKET BONE COLLECTION (BASKET) IMPLANT
BENZOIN TINCTURE PRP APPL 2/3 (GAUZE/BANDAGES/DRESSINGS) IMPLANT
BIT DRILL PLIF MAS DISP 5.5MM (DRILL) IMPLANT
BLADE BONE MILL FINE (MISCELLANEOUS) ×1
BLADE BONE MILL MEDIUM (MISCELLANEOUS) ×1 IMPLANT
BLADE CLIPPER SURG (BLADE) IMPLANT
BLADE MILL BN FN STRL DISP (MISCELLANEOUS) IMPLANT
BUR MATCHSTICK NEURO 3.0 LAGG (BURR) ×1 IMPLANT
BUR PRECISION FLUTE 5.0 (BURR) ×1 IMPLANT
CANISTER SUCT 3000ML PPV (MISCELLANEOUS) ×1 IMPLANT
CARTRIDGE OIL MAESTRO DRILL (MISCELLANEOUS) ×1 IMPLANT
CASCADIA AN CONVEX 8.5X28X11 (Cage) ×2 IMPLANT
CNTNR URN SCR LID CUP LEK RST (MISCELLANEOUS) ×1 IMPLANT
CONT SPEC 4OZ STRL OR WHT (MISCELLANEOUS) ×1
COVER BACK TABLE 60X90IN (DRAPES) ×1 IMPLANT
DERMABOND ADVANCED .7 DNX12 (GAUZE/BANDAGES/DRESSINGS) ×1 IMPLANT
DIFFUSER DRILL AIR PNEUMATIC (MISCELLANEOUS) ×1 IMPLANT
DRAPE C-ARM 42X72 X-RAY (DRAPES) ×2 IMPLANT
DRAPE C-ARMOR (DRAPES) IMPLANT
DRAPE LAPAROTOMY 100X72X124 (DRAPES) ×1 IMPLANT
DRAPE SURG 17X23 STRL (DRAPES) ×1 IMPLANT
DRILL PLIF MAS DISP 5.5MM (DRILL) ×1
DRSG OPSITE POSTOP 4X8 (GAUZE/BANDAGES/DRESSINGS) IMPLANT
DURAPREP 26ML APPLICATOR (WOUND CARE) ×1 IMPLANT
ELECT CAUTERY BLADE 6.4 (BLADE) IMPLANT
ELECT REM PT RETURN 9FT ADLT (ELECTROSURGICAL) ×1
ELECTRODE REM PT RTRN 9FT ADLT (ELECTROSURGICAL) ×1 IMPLANT
GAUZE 4X4 16PLY ~~LOC~~+RFID DBL (SPONGE) IMPLANT
GAUZE SPONGE 4X4 12PLY STRL (GAUZE/BANDAGES/DRESSINGS) IMPLANT
GLOVE BIOGEL PI IND STRL 7.0 (GLOVE) IMPLANT
GLOVE BIOGEL PI IND STRL 7.5 (GLOVE) IMPLANT
GLOVE ECLIPSE 6.5 STRL STRAW (GLOVE) IMPLANT
GLOVE ECLIPSE 7.0 STRL STRAW (GLOVE) IMPLANT
GLOVE ECLIPSE 7.5 STRL STRAW (GLOVE) IMPLANT
GLOVE EXAM NITRILE XL STR (GLOVE) IMPLANT
GLOVE SURG LTX SZ6.5 (GLOVE) ×2 IMPLANT
GOWN STRL REUS W/ TWL LRG LVL3 (GOWN DISPOSABLE) ×2 IMPLANT
GOWN STRL REUS W/ TWL XL LVL3 (GOWN DISPOSABLE) IMPLANT
GOWN STRL REUS W/TWL 2XL LVL3 (GOWN DISPOSABLE) IMPLANT
GOWN STRL REUS W/TWL LRG LVL3 (GOWN DISPOSABLE) ×2
GOWN STRL REUS W/TWL XL LVL3 (GOWN DISPOSABLE) ×4
INTERBODY CSCD AN CVX8.5X28X11 (Cage) IMPLANT
KIT BASIN OR (CUSTOM PROCEDURE TRAY) ×1 IMPLANT
KIT POSITION SURG JACKSON T1 (MISCELLANEOUS) ×1 IMPLANT
KIT TURNOVER KIT B (KITS) ×1 IMPLANT
MILL BONE PREP (MISCELLANEOUS) IMPLANT
NDL HYPO 18GX1.5 BLUNT FILL (NEEDLE) IMPLANT
NDL HYPO 25X1 1.5 SAFETY (NEEDLE) ×1 IMPLANT
NDL SPNL 18GX3.5 QUINCKE PK (NEEDLE) IMPLANT
NEEDLE HYPO 18GX1.5 BLUNT FILL (NEEDLE) ×1 IMPLANT
NEEDLE HYPO 25X1 1.5 SAFETY (NEEDLE) ×1 IMPLANT
NEEDLE SPNL 18GX3.5 QUINCKE PK (NEEDLE) IMPLANT
NS IRRIG 1000ML POUR BTL (IV SOLUTION) ×1 IMPLANT
OIL CARTRIDGE MAESTRO DRILL (MISCELLANEOUS) ×1
PACK LAMINECTOMY NEURO (CUSTOM PROCEDURE TRAY) ×1 IMPLANT
PAD ARMBOARD 7.5X6 YLW CONV (MISCELLANEOUS) ×2 IMPLANT
PENCIL BUTTON HOLSTER BLD 10FT (ELECTRODE) IMPLANT
ROD 70MM (Rod) ×2 IMPLANT
ROD SPNL 70XPREBNT NS MAS (Rod) IMPLANT
SCREW LOCK (Screw) ×6 IMPLANT
SCREW LOCK FXNS SPNE MAS PL (Screw) IMPLANT
SCREW SHANK RELINE MOD 6.5X35 (Screw) IMPLANT
SCREW TULIP 5.5 (Screw) IMPLANT
SPIKE FLUID TRANSFER (MISCELLANEOUS) ×1 IMPLANT
SPONGE SURGIFOAM ABS GEL 100 (HEMOSTASIS) ×1 IMPLANT
SPONGE T-LAP 4X18 ~~LOC~~+RFID (SPONGE) IMPLANT
STRIP CLOSURE SKIN 1/2X4 (GAUZE/BANDAGES/DRESSINGS) IMPLANT
SUT PROLENE 6 0 BV (SUTURE) IMPLANT
SUT VIC AB 0 CT1 18XCR BRD8 (SUTURE) ×1 IMPLANT
SUT VIC AB 0 CT1 8-18 (SUTURE) ×2
SUT VIC AB 2-0 CT1 18 (SUTURE) ×1 IMPLANT
SUT VIC AB 3-0 SH 8-18 (SUTURE) ×1 IMPLANT
TOWEL GREEN STERILE (TOWEL DISPOSABLE) ×1 IMPLANT
TOWEL GREEN STERILE FF (TOWEL DISPOSABLE) ×1 IMPLANT
TRAY FOLEY MTR SLVR 16FR STAT (SET/KITS/TRAYS/PACK) ×1 IMPLANT
WATER STERILE IRR 1000ML POUR (IV SOLUTION) ×1 IMPLANT

## 2022-05-15 NOTE — Anesthesia Procedure Notes (Signed)
Procedure Name: Intubation Date/Time: 05/15/2022 10:05 AM  Performed by: Betha Loa, CRNAPre-anesthesia Checklist: Patient identified, Emergency Drugs available, Suction available and Patient being monitored Patient Re-evaluated:Patient Re-evaluated prior to induction Oxygen Delivery Method: Circle System Utilized Preoxygenation: Pre-oxygenation with 100% oxygen Induction Type: IV induction Ventilation: Mask ventilation without difficulty Laryngoscope Size: Mac and 3 Grade View: Grade I Tube type: Oral Tube size: 7.5 mm Number of attempts: 1 Airway Equipment and Method: Stylet and Oral airway Placement Confirmation: ETT inserted through vocal cords under direct vision, positive ETCO2 and breath sounds checked- equal and bilateral Secured at: 21 cm Tube secured with: Tape Dental Injury: Teeth and Oropharynx as per pre-operative assessment

## 2022-05-15 NOTE — H&P (Signed)
BP (!) 153/96   Pulse 75   Temp 97.6 F (36.4 C) (Oral)   Resp 17   Ht 5\' 7"  (1.702 m)   Wt 64.4 kg   SpO2 97%   BMI 22.24 kg/m  Mr. [name] presents with adjacent segment disease, lumbar stenosis, and neurogenic claudication. This is at L2/3.  He is admitted for lumbar decompression. No Known Allergies Past Medical History:  Diagnosis Date   Hypertension    Past Surgical History:  Procedure Laterality Date   BACK SURGERY  2016   Dr. Christella Noa   BACK SURGERY  2020   Dr. Christella Noa   TOE SURGERY Left    has had pin placed and removed   TONSILLECTOMY     as a child   History reviewed. No pertinent family history. Social History   Socioeconomic History   Marital status: Married    Spouse name: Not on file   Number of children: Not on file   Years of education: Not on file   Highest education level: Not on file  Occupational History   Not on file  Tobacco Use   Smoking status: Former    Packs/day: 0.50    Years: 8.00    Total pack years: 4.00    Types: Cigarettes   Smokeless tobacco: Never  Vaping Use   Vaping Use: Never used  Substance and Sexual Activity   Alcohol use: Yes    Alcohol/week: 16.0 standard drinks of alcohol    Types: 16 Cans of beer per week    Comment: 2-3 drinks per week   Drug use: No   Sexual activity: Yes  Other Topics Concern   Not on file  Social History Narrative   Not on file   Social Determinants of Health   Financial Resource Strain: Not on file  Food Insecurity: Not on file  Transportation Needs: Not on file  Physical Activity: Not on file  Stress: Not on file  Social Connections: Not on file  Intimate Partner Violence: Not on file   Prior to Admission medications   Medication Sig Start Date End Date Taking? Authorizing Provider  amLODipine (NORVASC) 5 MG tablet Take 5 mg by mouth daily. 03/16/22  Yes [provider]  aspirin EC 81 MG tablet Take 81 mg by mouth daily.   Yes [provider]  escitalopram  (LEXAPRO) 5 MG tablet Take 5 mg by mouth daily. 03/19/22  Yes [provider]  valsartan (DIOVAN) 320 MG tablet Take 320 mg by mouth daily. 04/30/22  Yes [provider]  Omega-3 Fatty Acids (FISH OIL) 1000 MG CAPS Take 1,000 mg by mouth daily after breakfast.    [provider]   Physical Exam Constitutional:      General: He is not in acute distress.    Appearance: Normal appearance. He is normal weight.  HENT:     Head: Normocephalic and atraumatic.     Right Ear: External ear normal.     Left Ear: External ear normal.     Nose: Nose normal.     Mouth/Throat:     Mouth: Mucous membranes are moist.     Pharynx: Oropharynx is clear.  Eyes:     Extraocular Movements: Extraocular movements intact.     Conjunctiva/sclera: Conjunctivae normal.     Pupils: Pupils are equal, round, and reactive to light.  Cardiovascular:     Rate and Rhythm: Normal rate and regular rhythm.  Pulmonary:     Effort: Pulmonary effort is  normal.     Breath sounds: Normal breath sounds.  Abdominal:     General: Abdomen is flat.     Palpations: Abdomen is soft.  Musculoskeletal:        General: Normal range of motion.     Cervical back: Normal range of motion.  Skin:    General: Skin is warm and dry.  Neurological:     General: No focal deficit present.     Mental Status: He is alert and oriented to person, place, and time.     Cranial Nerves: No cranial nerve deficit.     Sensory: No sensory deficit.     Motor: No weakness.     Coordination: Coordination normal.     Gait: Gait abnormal.  Psychiatric:        Mood and Affect: Mood normal.        Behavior: Behavior normal.        Thought Content: Thought content normal.        Judgment: Judgment normal.   Admit for lumbar decompression and arthrodesis.

## 2022-05-15 NOTE — Transfer of Care (Signed)
Immediate Anesthesia Transfer of Care Note  Patient: ZARIUS FURR  Procedure(s) Performed: Lumbar Two-Three Posterior Lumbar Interbody Fusion  Patient Location: PACU  Anesthesia Type:General  Level of Consciousness: drowsy  Airway & Oxygen Therapy: Patient Spontanous Breathing and Patient connected to face mask oxygen  Post-op Assessment: Report given to RN and Post -op Vital signs reviewed and stable  Post vital signs: Reviewed and stable  Last Vitals:  Vitals Value Taken Time  BP 141/76 05/15/22 1421  Temp    Pulse 93 05/15/22 1424  Resp 14 05/15/22 1424  SpO2 92 % 05/15/22 1424  Vitals shown include unvalidated device data.  Last Pain:  Vitals:   05/15/22 0713  TempSrc:   PainSc: 2          Complications: No notable events documented.

## 2022-05-15 NOTE — Anesthesia Postprocedure Evaluation (Signed)
Anesthesia Post Note  Patient: [name]  Procedure(s) Performed: Lumbar Two-Three Posterior Lumbar Interbody Fusion     Patient location during evaluation: PACU Anesthesia Type: General Level of consciousness: awake and alert and oriented Pain management: pain level controlled Vital Signs Assessment: post-procedure vital signs reviewed and stable Respiratory status: spontaneous breathing, nonlabored ventilation and respiratory function stable Cardiovascular status: blood pressure returned to baseline and stable Postop Assessment: no apparent nausea or vomiting Anesthetic complications: no   No notable events documented.  Last Vitals:  Vitals:   05/15/22 1445 05/15/22 1500  BP: 115/79 123/75  Pulse: 92 90  Resp: 13 11  Temp:    SpO2: 96% 96%    Last Pain:  Vitals:   05/15/22 1445  TempSrc:   PainSc: Asleep                 Demetrios Byron A.

## 2022-05-16 DIAGNOSIS — Z87891 Personal history of nicotine dependence: Secondary | ICD-10-CM | POA: Diagnosis not present

## 2022-05-16 DIAGNOSIS — I10 Essential (primary) hypertension: Secondary | ICD-10-CM | POA: Diagnosis not present

## 2022-05-16 DIAGNOSIS — M4316 Spondylolisthesis, lumbar region: Secondary | ICD-10-CM | POA: Diagnosis not present

## 2022-05-16 DIAGNOSIS — Z79899 Other long term (current) drug therapy: Secondary | ICD-10-CM | POA: Diagnosis not present

## 2022-05-16 DIAGNOSIS — M48062 Spinal stenosis, lumbar region with neurogenic claudication: Secondary | ICD-10-CM | POA: Diagnosis not present

## 2022-05-16 DIAGNOSIS — Z7982 Long term (current) use of aspirin: Secondary | ICD-10-CM | POA: Diagnosis not present

## 2022-05-16 MED ORDER — METHOCARBAMOL 750 MG PO TABS: 750.0000 mg | ORAL_TABLET | Freq: Four times a day (QID) | ORAL | 3 refills | Status: AC | PRN

## 2022-05-16 MED ORDER — OXYCODONE-ACETAMINOPHEN 5-325 MG PO TABS: 1.0000 | ORAL_TABLET | ORAL | 0 refills | Status: AC | PRN

## 2022-05-16 NOTE — Progress Notes (Signed)
PT Cancellation Note  Patient Details Name: [name] MRN: [id number] DOB: 1951/04/26   Cancelled Treatment:    Reason Eval/Treat Not Completed: PT screened, no needs identified, will sign off Per OT, no skilled PT needs. If needs change, please re-consult.   Lou Miner, DPT  Acute Rehabilitation Services  Office: [phone]    Rudean Hitt 05/16/2022, 9:23 AM

## 2022-05-16 NOTE — Discharge Instructions (Signed)

## 2022-05-16 NOTE — Discharge Summary (Signed)
Physician Discharge Summary  Patient ID: JDEN WANT MRN: [id number] DOB/AGE: Feb 26, 1951 71 y.o.  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 The patient tolerated the procedure well and was taken to the recovery room and then to the floor in stable condition. The hospital course was routine. There were no complications. The wound remained clean dry and intact. Pt had appropriate back soreness. No complaints of leg pain or new N/T/W. The patient remained afebrile with stable vital signs, and tolerated a regular diet. The patient continued to increase activities, and pain was well controlled with oral pain medications.   Consults: None  Significant Diagnostic Studies: radiology: X-Ray: intraoperative   Treatments: surgery: L2/3 lumbar decompression and arthrodesis  Discharge Exam: Blood pressure 137/74, pulse 66, temperature 97.9 F (36.6 C), temperature source Oral, resp. rate 18, height 5\' 7"  (1.702 m), weight 64.4 kg, SpO2 94 %. Physical Exam: Patient is awake, A/O X 4, conversant, and in good spirits. Eyes open spontaneously. They are in NAD and VSS. Doing well. Speech is fluent and appropriate. MAEW with good strength. Sensation to light touch is intact. PERLA, EOMI. CNs grossly intact. Dressing is clean dry intact. Incision is well approximated with no drainage, erythema, or edema.   Disposition: Discharge disposition: 01-Home or Self Care        Allergies as of 05/16/2022   No Known  Allergies      Medication List     TAKE these medications    amLODipine 5 MG tablet Commonly known as: NORVASC Take 5 mg by mouth daily.   aspirin EC 81 MG tablet Take 81 mg by mouth daily.   escitalopram 5 MG tablet Commonly known as: LEXAPRO Take 5 mg by mouth daily.   Fish Oil 1000 MG Caps Take 1,000 mg by mouth daily after breakfast.   methocarbamol 750 MG tablet Commonly known as: Robaxin-750 Take 1 tablet (750 mg total) by mouth every 6 (six) hours as needed for muscle spasms.   oxyCODONE-acetaminophen 5-325 MG tablet Commonly known as: Percocet Take 1-2 tablets by mouth every 4 (four) hours as needed for severe pain.   valsartan 320 MG tablet Commonly known as: DIOVAN Take 320 mg by mouth daily.         Signed: Marvis Moeller 05/16/2022, 10:28 AM

## 2022-05-16 NOTE — Evaluation (Addendum)
Occupational Therapy Evaluation and Discharge Patient Details Name: [name] MRN: [id number] DOB: 1950/11/09 Today's Date: 05/16/2022   History of Present Illness Pt is 71 yo male who was admitted for lumbar decompression and arthrodesis.Pt is now s/p Lumbar Two-Three Posterior Lumbar Interbody Fusion.   Clinical Impression   Pt admitted with the above diagnoses. At baseline pt is independent with ADLs. Pt is currently mod I with ADLs. Pt completed 2 laps in the hall and 2x flight of stairs. Good toleration of OOB activity. Reviewed back education regarding precautions and compensatory strategies for ADLs. No further OT needs indicated. OT signing off.      Recommendations for follow up therapy are one component of a multi-disciplinary discharge planning process, led by the attending physician.  Recommendations may be updated based on patient status, additional functional criteria and insurance authorization.   Follow Up Recommendations  No OT follow up    Assistance Recommended at Discharge Set up Supervision/Assistance  Patient can return home with the following      Functional Status Assessment  Patient has not had a recent decline in their functional status  Equipment Recommendations  None recommended by OT    Recommendations for Other Services       Precautions / Restrictions Precautions Precautions: Back Restrictions Weight Bearing Restrictions: No      Mobility Bed Mobility Overal bed mobility: Modified Independent                  Transfers Overall transfer level: Modified independent Equipment used: None                      Balance Overall balance assessment: Mild deficits observed, not formally tested                                         ADL either performed or assessed with clinical judgement   ADL Overall ADL's : Modified independent                                       General ADL  Comments: Pt completed 2x laps in the hallway, flight of stairs 2x. Reviewed back education and provided handout.     Vision         Perception     Praxis      Pertinent Vitals/Pain Pain Assessment Pain Assessment: Faces Faces Pain Scale: Hurts little more Pain Location: back Pain Descriptors / Indicators: Tightness Pain Intervention(s): Monitored during session     Hand Dominance Right   Extremity/Trunk Assessment Upper Extremity Assessment Upper Extremity Assessment: Overall WFL for tasks assessed   Lower Extremity Assessment Lower Extremity Assessment: Overall WFL for tasks assessed   Cervical / Trunk Assessment Cervical / Trunk Assessment: Back Surgery   Communication Communication Communication: No difficulties   Cognition Arousal/Alertness: Awake/alert Behavior During Therapy: WFL for tasks assessed/performed Overall Cognitive Status: Within Functional Limits for tasks assessed                                       General Comments       Exercises     Shoulder Instructions      Home Living Family/patient expects to be  discharged to:: Private residence Living Arrangements: Spouse/significant other Available Help at Discharge: Family;Available 24 hours/day Type of Home: House Home Access: Stairs to enter [employer] of Steps: 3 Entrance Stairs-Rails: Right Home Layout: Two level Alternate Level Stairs-Number of Steps: full flight Alternate Level Stairs-Rails: Right Bathroom Shower/Tub: Walk-in shower         Home Equipment: Conservation officer, nature (2 wheels);Cane - single point;Shower seat;BSC/3in1          Prior Functioning/Environment Prior Level of Function : Independent/Modified Independent                        OT Problem List:        OT Treatment/Interventions:      OT Goals(Current goals can be found in the care plan section) Acute Rehab OT Goals Patient Stated Goal: home  OT Frequency:       Co-evaluation              AM-PAC OT "6 Clicks" Daily Activity     Outcome Measure Help from another person eating meals?: None Help from another person taking care of personal grooming?: None Help from another person toileting, which includes using toliet, bedpan, or urinal?: None Help from another person bathing (including washing, rinsing, drying)?: None Help from another person to put on and taking off regular upper body clothing?: None Help from another person to put on and taking off regular lower body clothing?: None 6 Click Score: 24   End of Session Nurse Communication: Mobility status  Activity Tolerance: Patient tolerated treatment well Patient left: in bed;with call bell/phone within reach (EOB)  OT Visit Diagnosis: Unsteadiness on feet (R26.81);Pain                Time: 0867-6195 OT Time Calculation (min): 28 min Charges:  OT General Charges $OT Visit: 1 Visit OT Evaluation $OT Eval Low Complexity: 1 Low OT Treatments $Self Care/Home Management : 8-22 mins  Tyrone Schimke, OT Acute Rehabilitation Services Office: [phone]   Hortencia Pilar 05/16/2022, 9:33 AM

## 2022-05-16 NOTE — Progress Notes (Signed)
Patient is discharged from room 3C11 at this time. Alert and in stable condition. IV site d/c'd and instructions read to patient and spouse with understanding verbalized and all questions answered. Left unit via wheelchair with all belongings at side. 

## 2022-05-20 NOTE — Op Note (Signed)
05/15/2022  3:32 PM  PATIENT:  [name]  71 y.o. 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  BLOOD ADMINISTERED:none  CELL SAVER GIVEN:none  COUNT:per nursing  DRAINS: none   SPECIMEN:  No Specimen  DICTATION: [name] is a 71 y.o. male whom was taken to the operating room intubated, and placed under a general anesthetic without difficulty. A foley catheter was placed under sterile conditions. He was positioned prone on a Jackson table with all pressure points properly padded.  His lumbar region was prepped and draped in a sterile manner. I infiltrated 20cc's 1/2%lidocaine/1:2000,000 strength epinephrine into the planned incision. I opened the skin with a 10 blade and took the incision down to the thoracolumbar fascia. I exposed the lamina of L1,2,and 3 in a subperiosteal fashion bilaterally. I confirmed my location with an intraoperative xray.  I placed self retaining retractors and started the decompression.  I decompressed the spinal canal via a near total laminectomy L2, inferior facetectomies L2 bilaterally and partial superior facetectomies L3. I used the drill and Kerrison punches to decompress the spinal canal. The lateral recesses and the foramina were all decompressed allowing for free egress of the L2 roots. The disc spaces were well exposed. PLIF's were performed at L2/3 in the same fashion.  I opened the disc space with a 15 blade then used a variety of instruments to remove the disc and prepare the space for the arthrodesis. I used curettes, rongeurs, punches, shavers for the disc space, and rasps in the discetomy. I measured the disc space and placed 54mm titanium cages (Stryker)packed with autograft morsels into the disc space(s).   I placed pedicle screws at L2, using fluoroscopic guidance. I drilled a pilot hole, then cannulated the pedicle with a drill at each site. I then tapped each pedicle, assessing each site for pedicle violations. No cutouts were appreciated. Screws (nuvasive) were then placed at each site without difficulty. We attached rods and locking caps with the appropriate tools. The locking caps were secured with torque limited screwdrivers. Final films were performed and the final construct appeared to be in good position.  I closed the wound in a layered fashion. I approximated the thoracolumbar fascia, subcutaneous, and subcuticular planes with vicryl sutures. I used dermabond, and an occlusive bandage for a sterile dressing.     PLAN OF CARE: Admit to inpatient   PATIENT DISPOSITION:  PACU - hemodynamically stable.   Delay start of Pharmacological VTE agent (>24hrs) due to surgical blood loss or risk of bleeding:  yes

## 2022-06-01 DIAGNOSIS — M48062 Spinal stenosis, lumbar region with neurogenic claudication: Secondary | ICD-10-CM | POA: Diagnosis not present

## 2022-06-18 DIAGNOSIS — R202 Paresthesia of skin: Secondary | ICD-10-CM | POA: Diagnosis not present

## 2022-06-18 DIAGNOSIS — I10 Essential (primary) hypertension: Secondary | ICD-10-CM | POA: Diagnosis not present

## 2022-10-22 DIAGNOSIS — M5416 Radiculopathy, lumbar region: Secondary | ICD-10-CM | POA: Diagnosis not present

## 2022-11-09 DIAGNOSIS — M48062 Spinal stenosis, lumbar region with neurogenic claudication: Secondary | ICD-10-CM | POA: Diagnosis not present

## 2022-11-09 DIAGNOSIS — M4310 Spondylolisthesis, site unspecified: Secondary | ICD-10-CM | POA: Diagnosis not present

## 2022-11-11 ENCOUNTER — Other Ambulatory Visit (HOSPITAL_COMMUNITY): Payer: Self-pay | Admitting: Neurosurgery

## 2022-11-11 DIAGNOSIS — M48062 Spinal stenosis, lumbar region with neurogenic claudication: Secondary | ICD-10-CM

## 2022-11-27 ENCOUNTER — Ambulatory Visit (HOSPITAL_COMMUNITY)
Admission: RE | Admit: 2022-11-27 | Discharge: 2022-11-27 | Disposition: A | Discharge status: Home / Self Care | Payer: Medicare Other | Source: Ambulatory Visit | Attending: Neurosurgery | Admitting: Neurosurgery

## 2022-11-27 DIAGNOSIS — M48061 Spinal stenosis, lumbar region without neurogenic claudication: Secondary | ICD-10-CM | POA: Diagnosis not present

## 2022-11-27 DIAGNOSIS — M48062 Spinal stenosis, lumbar region with neurogenic claudication: Secondary | ICD-10-CM | POA: Insufficient documentation

## 2022-11-30 DIAGNOSIS — M48062 Spinal stenosis, lumbar region with neurogenic claudication: Secondary | ICD-10-CM | POA: Diagnosis not present

## 2022-12-10 NOTE — Therapy (Signed)
OUTPATIENT PHYSICAL THERAPY THORACOLUMBAR EVALUATION   Patient Name: [name] MRN: [id number] DOB:May 10, 1951, 72 y.o., male Today's Date: 12/11/2022  END OF SESSION:  PT End of Session - 12/11/22 0754     Visit Number 1    Date for PT Re-Evaluation 03/05/23    PT Start Time 0758    PT Stop Time 0835    PT Time Calculation (min) 37 min    Activity Tolerance Patient tolerated treatment well    Behavior During Therapy [facility] for tasks assessed/performed             Past Medical History:  Diagnosis Date   Hypertension    Past Surgical History:  Procedure Laterality Date   BACK SURGERY  2016   Dr. Nicanor Bake SURGERY  2020   Dr. Franky Macho   TOE SURGERY Left    has had pin placed and removed   TONSILLECTOMY     as a child   Patient Active Problem List   Diagnosis Date Noted   Lumbar adjacent segment disease with spondylolisthesis 05/15/2022   Lumbar stenosis with neurogenic claudication 06/15/2019   Spondylolisthesis of lumbar region 05/27/2013    PCP: Duane Lope  REFERRING PROVIDER: Coletta Memos, MD  REFERRING DIAG:  Diagnosis  [phone] (ICD-10-CM) - Spinal stenosis, lumbar region with neurogenic claudication    Rationale for Evaluation and Treatment: Rehabilitation  THERAPY DIAG:  Abnormal posture  Muscle weakness (generalized)  Unsteadiness on feet  Other low back pain  ONSET DATE: 12/04/22  SUBJECTIVE:                                                                                                                                                                                           SUBJECTIVE STATEMENT: Patient reports 3 back surgeries since 2016, moving up lumbar spinal column. Last one was 10/23. He has had a lot more pain with the last procedure. He returned to the Dr recently due to pain in his kidney area. Kidney disease ruled out. CT appeared to show no issues with the surgery  PERTINENT HISTORY:  05/16/22-L2-3 fusion, L2  laminectomy  PAIN:  Are you having pain? Yes: NPRS scale: 7/10 Pain location: low back Pain description: nagging tooth ache, catches him and throws him off balance Aggravating factors: No pattern, except when he sneezes or coughs Relieving factors: Can't identify anything  PRECAUTIONS: None  WEIGHT BEARING RESTRICTIONS: No  FALLS:  Has patient fallen in last 6 months? No  LIVING ENVIRONMENT: Lives with: lives with their family Lives in: House/apartment Stairs: Yes: Internal: 14 steps; on left going up and External: 3 steps; bilateral but  cannot reach both Has following equipment at home: None  OCCUPATION: Retired, ride bike 39m/day, yard work  PLOF: Independent  PATIENT GOALS: Decrease pain to allow him to return to his normal routine.  NEXT MD VISIT: about 12/27/22  OBJECTIVE:   DIAGNOSTIC FINDINGS:  CT 11/27/22 IMPRESSION: 1. Interval discectomy and fusion procedure at the L2-3 level. Hardware components appear well positioned. Considerable lucency surrounding the L2 pedicle screws consistent with ongoing motion. Minimal subsidence of the interbody spacers. Apparent sufficient patency of the canal and foramina. 2. Previous posterior decompression, diskectomy and fusion procedure from L3 through L5 with solid union and sufficient patency of the canal and foramina. 3. L5-S1 chronic disc degeneration with vacuum phenomenon. Endplate osteophytes and bulging of the disc more prominent towards the right. Mild bilateral foraminal stenosis with some potential to affect the exiting L5 nerves. Stenosis of the subarticular lateral recesses, right more than left, that could compress either S1 nerve, particularly the right. 4. Aortic atherosclerosis. Maximal measured diameter 3.0 cm.  PATIENT SURVEYS:  FOTO 58.2  SCREENING FOR RED FLAGS: Bowel or bladder incontinence: No Spinal tumors: No Cauda equina syndrome: No Compression fracture: No Abdominal aneurysm:  No  COGNITION: Overall cognitive status: Within functional limits for tasks assessed     SENSATION: WFL  MUSCLE LENGTH: Hamstrings: Right 68 deg; Left 49 deg Thomas test: WFL B  POSTURE: forward head, decreased lumbar lordosis, and decreased thoracic kyphosis  PALPATION: No real TTP, but lower back muscles very tight B.  LUMBAR ROM:   AROM eval  Flexion Mid shin P  Extension 50%  Right lateral flexion 3" above knee P  Left lateral flexion 3" above knee P  Right rotation 60%  Left rotation 90%   (Blank rows = not tested)  LOWER EXTREMITY ROM: B hip tightness in all planes.  LOWER EXTREMITY MMT:  B hips 4-/5, otherwise WNL  LUMBAR SPECIAL TESTS:  Straight leg raise test: Negative, Slump test: Negative, and FABER test: Positive  FUNCTIONAL TESTS:  5 times sit to stand: 10.2 WNL  GAIT: Distance walked: In clinic distances Assistive device utilized: None Level of assistance: Complete Independence Comments: Patient unsteady upon first standing in lobby, but otherwise no issues.  TODAY'S TREATMENT:                                                                                                                              DATE:  12/11/22 Education Stretch in sit for HS, ITB, gluts, demonstrated supine SKTC, DKTC   PATIENT EDUCATION:  Education details: POC Person educated: Patient Education method: Explanation Education comprehension: verbalized understanding  HOME EXERCISE PROGRAM: [id number]  ASSESSMENT:  CLINICAL IMPRESSION: Patient is a 72 y.o. who was seen today for physical therapy evaluation and treatment for LBP. He has had 3 lumbar surgeries since 2016. He recovered well form the first 2, but has had more difficulty after the last on 10/23. He has pain in his lower back, which  shows no pattern or specific cause, but it catches him and is very sharp. He demonstrates tightness and weakness in B hips and low back with multiple trigger points in lower lumbar  area. He guards at times due to fear of pain. He is active, rides a stationary bike x 5 miles each day, but feels limited in his normal activities due to the pain. He will benefit from PT to address the tightness and weakness in order to decrease his pain, improve postural control, and allow him to return to his normal daily routine without pain.  OBJECTIVE IMPAIRMENTS: decreased activity tolerance, decreased balance, decreased endurance, difficulty walking, decreased ROM, decreased strength, impaired flexibility, improper body mechanics, postural dysfunction, and pain.   ACTIVITY LIMITATIONS: carrying, lifting, bending, sitting, standing, squatting, and locomotion level  PARTICIPATION LIMITATIONS: meal prep, cleaning, driving, shopping, and community activity  PERSONAL FACTORS: Age and Past/current experiences are also affecting patient's functional outcome.   REHAB POTENTIAL: Good  CLINICAL DECISION MAKING: Evolving/moderate complexity  EVALUATION COMPLEXITY: Moderate   GOALS: Goals reviewed with patient? Yes  SHORT TERM GOALS: Target date: 12/23/22  I with initial HEP Baseline: Goal status: INITIAL  LONG TERM GOALS: Target date: 03/05/23  I with final HEP Baseline:  Goal status: INITIAL  2.  Increase FOTO to at least 68 Baseline: 52 Goal status: INITIAL  3.  Patient will be able to perform all his normal daily activities with no episode of back pain x 1 week. Baseline: up to 7/10 Goal status: INITIAL  4.  Patient will walk on level and unlevel surfaces x at least 800' I with no C/O pain in back Baseline:  Goal status: INITIAL  5.  Up and down a flight of steps, step over step, with no pain or unsteadiness. Baseline:  Goal status: INITIAL  PLAN:  PT FREQUENCY: 1-2x/week  PT DURATION: 12 weeks  PLANNED INTERVENTIONS: Therapeutic exercises, Therapeutic activity, Neuromuscular re-education, Balance training, Gait training, Patient/Family education, Self Care, Joint  mobilization, Stair training, Dry Needling, Electrical stimulation, Spinal manipulation, Cryotherapy, Moist heat, Ionotophoresis 4mg /ml Dexamethasone, and Manual therapy.  PLAN FOR NEXT SESSION: STM and stretch to low back, update HEP with strengthening for trunk and hips.   Iona Beard, DPT 12/11/2022, 11:56 AM

## 2022-12-11 ENCOUNTER — Encounter: Payer: Self-pay | Admitting: Physical Therapy

## 2022-12-11 ENCOUNTER — Ambulatory Visit: Payer: Medicare Other | Attending: Neurosurgery | Admitting: Physical Therapy

## 2022-12-11 DIAGNOSIS — M6281 Muscle weakness (generalized): Secondary | ICD-10-CM | POA: Insufficient documentation

## 2022-12-11 DIAGNOSIS — R2681 Unsteadiness on feet: Secondary | ICD-10-CM | POA: Diagnosis not present

## 2022-12-11 DIAGNOSIS — R293 Abnormal posture: Secondary | ICD-10-CM | POA: Insufficient documentation

## 2022-12-11 DIAGNOSIS — M5459 Other low back pain: Secondary | ICD-10-CM | POA: Diagnosis not present

## 2022-12-16 ENCOUNTER — Ambulatory Visit: Payer: Medicare Other | Admitting: Physical Therapy

## 2022-12-16 ENCOUNTER — Encounter: Payer: Self-pay | Admitting: Physical Therapy

## 2022-12-16 DIAGNOSIS — M5459 Other low back pain: Secondary | ICD-10-CM

## 2022-12-16 DIAGNOSIS — R2681 Unsteadiness on feet: Secondary | ICD-10-CM

## 2022-12-16 DIAGNOSIS — R293 Abnormal posture: Secondary | ICD-10-CM

## 2022-12-16 DIAGNOSIS — M6281 Muscle weakness (generalized): Secondary | ICD-10-CM

## 2022-12-16 NOTE — Therapy (Signed)
OUTPATIENT PHYSICAL THERAPY THORACOLUMBAR EVALUATION   Patient Name: [name] MRN: [id number] DOB:[birth year], 72 y.o., male Today's Date: 12/16/2022  END OF SESSION:  PT End of Session - 12/16/22 0849     Visit Number 2    Date for PT Re-Evaluation 03/05/23    PT Start Time 0845    PT Stop Time 0930    PT Time Calculation (min) 45 min    Activity Tolerance Patient tolerated treatment well    Behavior During Therapy [facility] for tasks assessed/performed             Past Medical History:  Diagnosis Date   Hypertension    Past Surgical History:  Procedure Laterality Date   BACK SURGERY  2016   Dr. Nicanor Bake SURGERY  2020   Dr. Franky Macho   TOE SURGERY Left    has had pin placed and removed   TONSILLECTOMY     as a child   Patient Active Problem List   Diagnosis Date Noted   Lumbar adjacent segment disease with spondylolisthesis 05/15/2022   Lumbar stenosis with neurogenic claudication 06/15/2019   Spondylolisthesis of lumbar region 05/27/2013    PCP: Duane Lope  REFERRING PROVIDER: Coletta Memos, MD  REFERRING DIAG:  Diagnosis  [phone] (ICD-10-CM) - Spinal stenosis, lumbar region with neurogenic claudication    Rationale for Evaluation and Treatment: Rehabilitation  THERAPY DIAG:  Abnormal posture  Muscle weakness (generalized)  Other low back pain  Unsteadiness on feet  ONSET DATE: 12/04/22  SUBJECTIVE:                                                                                                                                                                                           SUBJECTIVE STATEMENT: Worst in the morning.   PERTINENT HISTORY:  05/16/22-L2-3 fusion, L2 laminectomy  PAIN:  Are you having pain? Yes: NPRS scale: 6/10 Pain location: low back Pain description: nagging tooth ache, catches him and throws him off balance Aggravating factors: No pattern, except when he sneezes or coughs Relieving factors: Can't identify  anything  PRECAUTIONS: None  WEIGHT BEARING RESTRICTIONS: No  FALLS:  Has patient fallen in last 6 months? No  LIVING ENVIRONMENT: Lives with: lives with their family Lives in: House/apartment Stairs: Yes: Internal: 14 steps; on left going up and External: 3 steps; bilateral but cannot reach both Has following equipment at home: None  OCCUPATION: Retired, ride bike 34m/day, yard work  PLOF: Independent  PATIENT GOALS: Decrease pain to allow him to return to his normal routine.  NEXT MD VISIT: about 12/27/22  OBJECTIVE:   DIAGNOSTIC FINDINGS:  CT  11/27/22 IMPRESSION: 1. Interval discectomy and fusion procedure at the L2-3 level. Hardware components appear well positioned. Considerable lucency surrounding the L2 pedicle screws consistent with ongoing motion. Minimal subsidence of the interbody spacers. Apparent sufficient patency of the canal and foramina. 2. Previous posterior decompression, diskectomy and fusion procedure from L3 through L5 with solid union and sufficient patency of the canal and foramina. 3. L5-S1 chronic disc degeneration with vacuum phenomenon. Endplate osteophytes and bulging of the disc more prominent towards the right. Mild bilateral foraminal stenosis with some potential to affect the exiting L5 nerves. Stenosis of the subarticular lateral recesses, right more than left, that could compress either S1 nerve, particularly the right. 4. Aortic atherosclerosis. Maximal measured diameter 3.0 cm.  PATIENT SURVEYS:  FOTO 58.2  SCREENING FOR RED FLAGS: Bowel or bladder incontinence: No Spinal tumors: No Cauda equina syndrome: No Compression fracture: No Abdominal aneurysm: No  COGNITION: Overall cognitive status: Within functional limits for tasks assessed     SENSATION: WFL  MUSCLE LENGTH: Hamstrings: Right 68 deg; Left 49 deg Thomas test: WFL B  POSTURE: forward head, decreased lumbar lordosis, and decreased thoracic  kyphosis  PALPATION: No real TTP, but lower back muscles very tight B.  LUMBAR ROM:   AROM eval  Flexion Mid shin P  Extension 50%  Right lateral flexion 3" above knee P  Left lateral flexion 3" above knee P  Right rotation 60%  Left rotation 90%   (Blank rows = not tested)  LOWER EXTREMITY ROM: B hip tightness in all planes.  LOWER EXTREMITY MMT:  B hips 4-/5, otherwise WNL  LUMBAR SPECIAL TESTS:  Straight leg raise test: Negative, Slump test: Negative, and FABER test: Positive  FUNCTIONAL TESTS:  5 times sit to stand: 10.2 WNL  GAIT: Distance walked: In clinic distances Assistive device utilized: None Level of assistance: Complete Independence Comments: Patient unsteady upon first standing in lobby, but otherwise no issues.  TODAY'S TREATMENT:                                                                                                                              DATE:  12/16/22 NuStep L 5 x 6 min Shoulder Ext 5lb 2x10 Standing rows 10lb 2x10 Hamstring curls 20lb 2x10 Leg Ext 5lb 2x10 S2S OHP red ball x5, x10 Bridges x10 Passibe HS, Piriformis, Single K2C 12/11/22 Education Stretch in sit for HS, ITB, gluts, demonstrated supine SKTC, DKTC   PATIENT EDUCATION:  Education details: POC Person educated: Patient Education method: Explanation Education comprehension: verbalized understanding  HOME EXERCISE PROGRAM: [id number]  ASSESSMENT:  CLINICAL IMPRESSION: Patient is a 72 y.o. who was seen today for physical therapy evaluation and treatment for LBP. He enters doing ok, reporting pain in the morning that thnds to decrease throughout the day. Pt has a forward head and rounded shoulders at rest. Postural cue needed with rows and extensions. Weakness and fatigue present with S2S. He will benefit from PT to  address the tightness and weakness in order to decrease his pain, improve postural control, and allow him to return to his normal daily routine without  pain.  OBJECTIVE IMPAIRMENTS: decreased activity tolerance, decreased balance, decreased endurance, difficulty walking, decreased ROM, decreased strength, impaired flexibility, improper body mechanics, postural dysfunction, and pain.   ACTIVITY LIMITATIONS: carrying, lifting, bending, sitting, standing, squatting, and locomotion level  PARTICIPATION LIMITATIONS: meal prep, cleaning, driving, shopping, and community activity  PERSONAL FACTORS: Age and Past/current experiences are also affecting patient's functional outcome.   REHAB POTENTIAL: Good  CLINICAL DECISION MAKING: Evolving/moderate complexity  EVALUATION COMPLEXITY: Moderate   GOALS: Goals reviewed with patient? Yes  SHORT TERM GOALS: Target date: 12/23/22  I with initial HEP Baseline: Goal status: INITIAL  LONG TERM GOALS: Target date: 03/05/23  I with final HEP Baseline:  Goal status: INITIAL  2.  Increase FOTO to at least 68 Baseline: 52 Goal status: INITIAL  3.  Patient will be able to perform all his normal daily activities with no episode of back pain x 1 week. Baseline: up to 7/10 Goal status: INITIAL  4.  Patient will walk on level and unlevel surfaces x at least 800' I with no C/O pain in back Baseline:  Goal status: INITIAL  5.  Up and down a flight of steps, step over step, with no pain or unsteadiness. Baseline:  Goal status: INITIAL  PLAN:  PT FREQUENCY: 1-2x/week  PT DURATION: 12 weeks  PLANNED INTERVENTIONS: Therapeutic exercises, Therapeutic activity, Neuromuscular re-education, Balance training, Gait training, Patient/Family education, Self Care, Joint mobilization, Stair training, Dry Needling, Electrical stimulation, Spinal manipulation, Cryotherapy, Moist heat, Ionotophoresis 4mg /ml Dexamethasone, and Manual therapy.  PLAN FOR NEXT SESSION: STM and stretch to low back, update HEP with strengthening for trunk and hips.   Iona Beard, DPT 12/16/2022, 8:49 AM

## 2022-12-22 ENCOUNTER — Encounter: Payer: Self-pay | Admitting: Physical Therapy

## 2022-12-22 ENCOUNTER — Ambulatory Visit: Payer: Medicare Other | Admitting: Physical Therapy

## 2022-12-22 DIAGNOSIS — R2681 Unsteadiness on feet: Secondary | ICD-10-CM | POA: Diagnosis not present

## 2022-12-22 DIAGNOSIS — M6281 Muscle weakness (generalized): Secondary | ICD-10-CM | POA: Diagnosis not present

## 2022-12-22 DIAGNOSIS — M5459 Other low back pain: Secondary | ICD-10-CM

## 2022-12-22 DIAGNOSIS — R293 Abnormal posture: Secondary | ICD-10-CM

## 2022-12-22 NOTE — Therapy (Signed)
OUTPATIENT PHYSICAL THERAPY THORACOLUMBAR TREATMENT   Patient Name: [name] MRN: [id number] DOB:Mar 21, 1951, 72 y.o., male Today's Date: 12/22/2022  END OF SESSION:  PT End of Session - 12/22/22 0756     Visit Number 3    Date for PT Re-Evaluation 03/05/23    PT Start Time 0800    PT Stop Time 0845    PT Time Calculation (min) 45 min    Activity Tolerance Patient tolerated treatment well    Behavior During Therapy [facility] for tasks assessed/performed             Past Medical History:  Diagnosis Date   Hypertension    Past Surgical History:  Procedure Laterality Date   BACK SURGERY  2016   Dr. Nicanor Bake SURGERY  2020   Dr. Franky Macho   TOE SURGERY Left    has had pin placed and removed   TONSILLECTOMY     as a child   Patient Active Problem List   Diagnosis Date Noted   Lumbar adjacent segment disease with spondylolisthesis 05/15/2022   Lumbar stenosis with neurogenic claudication 06/15/2019   Spondylolisthesis of lumbar region 05/27/2013    PCP: Duane Lope  REFERRING PROVIDER: Coletta Memos, MD  REFERRING DIAG:  Diagnosis  [phone] (ICD-10-CM) - Spinal stenosis, lumbar region with neurogenic claudication    Rationale for Evaluation and Treatment: Rehabilitation  THERAPY DIAG:  Abnormal posture  Muscle weakness (generalized)  Other low back pain  ONSET DATE: 12/04/22  SUBJECTIVE:                                                                                                                                                                                           SUBJECTIVE STATEMENT: "A little tight"  PERTINENT HISTORY:  05/16/22-L2-3 fusion, L2 laminectomy  PAIN:  Are you having pain? Yes: NPRS scale: 3/10 Pain location: low back Pain description: nagging tooth ache, catches him and throws him off balance Aggravating factors: No pattern, except when he sneezes or coughs Relieving factors: Can't identify anything  PRECAUTIONS:  None  WEIGHT BEARING RESTRICTIONS: No  FALLS:  Has patient fallen in last 6 months? No  LIVING ENVIRONMENT: Lives with: lives with their family Lives in: House/apartment Stairs: Yes: Internal: 14 steps; on left going up and External: 3 steps; bilateral but cannot reach both Has following equipment at home: None  OCCUPATION: Retired, ride bike 64m/day, yard work  PLOF: Independent  PATIENT GOALS: Decrease pain to allow him to return to his normal routine.  NEXT MD VISIT: about 12/27/22  OBJECTIVE:   DIAGNOSTIC FINDINGS:  CT 11/27/22 IMPRESSION: 1. Interval discectomy and  fusion procedure at the L2-3 level. Hardware components appear well positioned. Considerable lucency surrounding the L2 pedicle screws consistent with ongoing motion. Minimal subsidence of the interbody spacers. Apparent sufficient patency of the canal and foramina. 2. Previous posterior decompression, diskectomy and fusion procedure from L3 through L5 with solid union and sufficient patency of the canal and foramina. 3. L5-S1 chronic disc degeneration with vacuum phenomenon. Endplate osteophytes and bulging of the disc more prominent towards the right. Mild bilateral foraminal stenosis with some potential to affect the exiting L5 nerves. Stenosis of the subarticular lateral recesses, right more than left, that could compress either S1 nerve, particularly the right. 4. Aortic atherosclerosis. Maximal measured diameter 3.0 cm.  PATIENT SURVEYS:  FOTO 58.2  SCREENING FOR RED FLAGS: Bowel or bladder incontinence: No Spinal tumors: No Cauda equina syndrome: No Compression fracture: No Abdominal aneurysm: No  COGNITION: Overall cognitive status: Within functional limits for tasks assessed     SENSATION: WFL  MUSCLE LENGTH: Hamstrings: Right 68 deg; Left 49 deg Thomas test: WFL B  POSTURE: forward head, decreased lumbar lordosis, and decreased thoracic kyphosis  PALPATION: No real TTP, but lower  back muscles very tight B.  LUMBAR ROM:   AROM eval  Flexion Mid shin P  Extension 50%  Right lateral flexion 3" above knee P  Left lateral flexion 3" above knee P  Right rotation 60%  Left rotation 90%   (Blank rows = not tested)  LOWER EXTREMITY ROM: B hip tightness in all planes.  LOWER EXTREMITY MMT:  B hips 4-/5, otherwise WNL  LUMBAR SPECIAL TESTS:  Straight leg raise test: Negative, Slump test: Negative, and FABER test: Positive  FUNCTIONAL TESTS:  5 times sit to stand: 10.2 WNL  GAIT: Distance walked: In clinic distances Assistive device utilized: None Level of assistance: Complete Independence Comments: Patient unsteady upon first standing in lobby, but otherwise no issues.  TODAY'S TREATMENT:                                                                                                                              DATE:  12/22/22 UBE L2 x 2 min each S2S OHP yellow ball 2 x10 Bike L2 x 5 min  Rows & Lats 20lb 2x10 S2S OHP yellow ball 2x10 Horiz shoulder abd green 2x10 LE on Pball bridges, oblq, K2C Passibe HS, Piriformis, Single K2C  12/16/22 NuStep L 5 x 6 min Shoulder Ext 5lb 2x10 Standing rows 10lb 2x10 Hamstring curls 20lb 2x10 Leg Ext 5lb 2x10 S2S OHP red ball x5, x10 Bridges x10 Passibe HS, Piriformis, Single K2C  12/11/22 Education Stretch in sit for HS, ITB, gluts, demonstrated supine SKTC, DKTC   PATIENT EDUCATION:  Education details: POC Person educated: Patient Education method: Explanation Education comprehension: verbalized understanding  HOME EXERCISE PROGRAM: [id number]  ASSESSMENT:  CLINICAL IMPRESSION: Patient is a 72 y.o. who was seen today for physical therapy treatment for LBP. He enters doing ok with litle  pain and stiffness. Some L quad tightness after bike warm up. Pt has a forward head and rounded shoulders at rest. Postural cue needed with seated rows not to sway trunk. Weakness and fatigue present with S2S. Pt is very  tight in the R piriformis. He will benefit from PT to address the tightness and weakness in order to decrease his pain, improve postural control, and allow him to return to his normal daily routine without pain.  OBJECTIVE IMPAIRMENTS: decreased activity tolerance, decreased balance, decreased endurance, difficulty walking, decreased ROM, decreased strength, impaired flexibility, improper body mechanics, postural dysfunction, and pain.   ACTIVITY LIMITATIONS: carrying, lifting, bending, sitting, standing, squatting, and locomotion level  PARTICIPATION LIMITATIONS: meal prep, cleaning, driving, shopping, and community activity  PERSONAL FACTORS: Age and Past/current experiences are also affecting patient's functional outcome.   REHAB POTENTIAL: Good  CLINICAL DECISION MAKING: Evolving/moderate complexity  EVALUATION COMPLEXITY: Moderate   GOALS: Goals reviewed with patient? Yes  SHORT TERM GOALS: Target date: 12/23/22  I with initial HEP Baseline: Goal status: INITIAL  LONG TERM GOALS: Target date: 03/05/23  I with final HEP Baseline:  Goal status: INITIAL  2.  Increase FOTO to at least 68 Baseline: 52 Goal status: INITIAL  3.  Patient will be able to perform all his normal daily activities with no episode of back pain x 1 week. Baseline: up to 7/10 Goal status: INITIAL  4.  Patient will walk on level and unlevel surfaces x at least 800' I with no C/O pain in back Baseline:  Goal status: INITIAL  5.  Up and down a flight of steps, step over step, with no pain or unsteadiness. Baseline:  Goal status: INITIAL  PLAN:  PT FREQUENCY: 1-2x/week  PT DURATION: 12 weeks  PLANNED INTERVENTIONS: Therapeutic exercises, Therapeutic activity, Neuromuscular re-education, Balance training, Gait training, Patient/Family education, Self Care, Joint mobilization, Stair training, Dry Needling, Electrical stimulation, Spinal manipulation, Cryotherapy, Moist heat, Ionotophoresis 4mg /ml  Dexamethasone, and Manual therapy.  PLAN FOR NEXT SESSION: STM and stretch to low back, update HEP with strengthening for trunk and hips.   Iona Beard, DPT 12/22/2022, 7:57 AM

## 2022-12-24 ENCOUNTER — Ambulatory Visit: Payer: Medicare Other | Attending: Neurosurgery | Admitting: Physical Therapy

## 2022-12-24 DIAGNOSIS — R293 Abnormal posture: Secondary | ICD-10-CM

## 2022-12-24 DIAGNOSIS — M5459 Other low back pain: Secondary | ICD-10-CM | POA: Insufficient documentation

## 2022-12-24 DIAGNOSIS — M6281 Muscle weakness (generalized): Secondary | ICD-10-CM | POA: Diagnosis not present

## 2022-12-24 DIAGNOSIS — R2681 Unsteadiness on feet: Secondary | ICD-10-CM | POA: Diagnosis not present

## 2022-12-24 NOTE — Therapy (Signed)
OUTPATIENT PHYSICAL THERAPY THORACOLUMBAR TREATMENT   Patient Name: [name] MRN: [id number] DOB:[birth year], 72 y.o., male Today's Date: 12/24/2022  END OF SESSION:  PT End of Session - 12/24/22 0840     Visit Number 4    Date for PT Re-Evaluation 03/05/23    PT Start Time 0842    PT Stop Time 0926    PT Time Calculation (min) 44 min             Past Medical History:  Diagnosis Date   Hypertension    Past Surgical History:  Procedure Laterality Date   BACK SURGERY  2016   Dr. Nicanor Bake SURGERY  2020   Dr. Franky Macho   TOE SURGERY Left    has had pin placed and removed   TONSILLECTOMY     as a child   Patient Active Problem List   Diagnosis Date Noted   Lumbar adjacent segment disease with spondylolisthesis 05/15/2022   Lumbar stenosis with neurogenic claudication 06/15/2019   Spondylolisthesis of lumbar region 05/27/2013    PCP: Duane Lope  REFERRING PROVIDER: Coletta Memos, MD  REFERRING DIAG:  Diagnosis  [phone] (ICD-10-CM) - Spinal stenosis, lumbar region with neurogenic claudication    Rationale for Evaluation and Treatment: Rehabilitation  THERAPY DIAG:  Abnormal posture  Muscle weakness (generalized)  Other low back pain  ONSET DATE: 12/04/22  SUBJECTIVE:                                                                                                                                                                                           SUBJECTIVE STATEMENT: Doing okay  PERTINENT HISTORY:  05/16/22-L2-3 fusion, L2 laminectomy  PAIN:  Are you having pain? Yes: NPRS scale: 3/10 Pain location: low back Pain description: nagging tooth ache, catches him and throws him off balance Aggravating factors: No pattern, except when he sneezes or coughs Relieving factors: Can't identify anything  PRECAUTIONS: None  WEIGHT BEARING RESTRICTIONS: No  FALLS:  Has patient fallen in last 6 months? No  LIVING ENVIRONMENT: Lives with: lives  with their family Lives in: House/apartment Stairs: Yes: Internal: 14 steps; on left going up and External: 3 steps; bilateral but cannot reach both Has following equipment at home: None  OCCUPATION: Retired, ride bike 51m/day, yard work  PLOF: Independent  PATIENT GOALS: Decrease pain to allow him to return to his normal routine.  NEXT MD VISIT: about 12/27/22  OBJECTIVE:   DIAGNOSTIC FINDINGS:  CT 11/27/22 IMPRESSION: 1. Interval discectomy and fusion procedure at the L2-3 level. Hardware components appear well positioned. Considerable lucency surrounding the L2 pedicle screws consistent with  ongoing motion. Minimal subsidence of the interbody spacers. Apparent sufficient patency of the canal and foramina. 2. Previous posterior decompression, diskectomy and fusion procedure from L3 through L5 with solid union and sufficient patency of the canal and foramina. 3. L5-S1 chronic disc degeneration with vacuum phenomenon. Endplate osteophytes and bulging of the disc more prominent towards the right. Mild bilateral foraminal stenosis with some potential to affect the exiting L5 nerves. Stenosis of the subarticular lateral recesses, right more than left, that could compress either S1 nerve, particularly the right. 4. Aortic atherosclerosis. Maximal measured diameter 3.0 cm.  PATIENT SURVEYS:  FOTO 58.2  SCREENING FOR RED FLAGS: Bowel or bladder incontinence: No Spinal tumors: No Cauda equina syndrome: No Compression fracture: No Abdominal aneurysm: No  COGNITION: Overall cognitive status: Within functional limits for tasks assessed     SENSATION: WFL  MUSCLE LENGTH: Hamstrings: Right 68 deg; Left 49 deg Thomas test: WFL B  POSTURE: forward head, decreased lumbar lordosis, and decreased thoracic kyphosis  PALPATION: No real TTP, but lower back muscles very tight B.  LUMBAR ROM:   AROM eval  Flexion Mid shin P  Extension 50%  Right lateral flexion 3" above knee P   Left lateral flexion 3" above knee P  Right rotation 60%  Left rotation 90%   (Blank rows = not tested)  LOWER EXTREMITY ROM: B hip tightness in all planes.  LOWER EXTREMITY MMT:  B hips 4-/5, otherwise WNL  LUMBAR SPECIAL TESTS:  Straight leg raise test: Negative, Slump test: Negative, and FABER test: Positive  FUNCTIONAL TESTS:  5 times sit to stand: 10.2 WNL  GAIT: Distance walked: In clinic distances Assistive device utilized: None Level of assistance: Complete Independence Comments: Patient unsteady upon first standing in lobby, but otherwise no issues.  TODAY'S TREATMENT:                                                                                                                              DATE:   12/24/22 Nustep L 5 - cued to slow speed as he tends to go very fast, some medial left knee issues after Rows & Lats 25# 2x10 Black tband trunk flex and ext 20  S2S OHP yellow ball 2x10 Green tband stadning shld ext,row,ER and horz abd 15 x each Ppt with cuing- very difficult for pt to engage Supine green tband clams and hip flex with ppt 20 x LE on Pball bridges, oblq, K2C.iso abdominals PROM HS, Piriformis, Single K2C, ITB    12/22/22 UBE L2 x 2 min each S2S OHP yellow ball 2 x10 Bike L2 x 5 min  Rows & Lats 20lb 2x10 S2S OHP yellow ball 2x10 Horiz shoulder abd green 2x10 LE on Pball bridges, oblq, K2C Passibe HS, Piriformis, Single K2C  12/16/22 NuStep L 5 x 6 min Shoulder Ext 5lb 2x10 Standing rows 10lb 2x10 Hamstring curls 20lb 2x10 Leg Ext 5lb 2x10 S2S OHP red ball x5, x10 Bridges x10  Passibe HS, Piriformis, Single K2C  12/11/22 Education Stretch in sit for HS, ITB, gluts, demonstrated supine SKTC, DKTC   PATIENT EDUCATION:  Education details: POC Person educated: Patient Education method: Explanation Education comprehension: verbalized understanding  HOME EXERCISE PROGRAM: [id number]  ASSESSMENT:  CLINICAL IMPRESSION: pt states doing  HEP ,STG met. Pt tolerated ex well but does need cuing for speed and control of mvmts as he tends to go to fast and compromised technique. Tightness noted in LE and trunk. Left medial knee pain after nustep and stated same thing happened last session on bike so may need to for go these machines. OBJECTIVE IMPAIRMENTS: decreased activity tolerance, decreased balance, decreased endurance, difficulty walking, decreased ROM, decreased strength, impaired flexibility, improper body mechanics, postural dysfunction, and pain.   ACTIVITY LIMITATIONS: carrying, lifting, bending, sitting, standing, squatting, and locomotion level  PARTICIPATION LIMITATIONS: meal prep, cleaning, driving, shopping, and community activity  PERSONAL FACTORS: Age and Past/current experiences are also affecting patient's functional outcome.   REHAB POTENTIAL: Good  CLINICAL DECISION MAKING: Evolving/moderate complexity  EVALUATION COMPLEXITY: Moderate   GOALS: Goals reviewed with patient? Yes  SHORT TERM GOALS: Target date: 12/23/22  I with initial HEP Baseline: Goal status: met 12/24/22  LONG TERM GOALS: Target date: 03/05/23  I with final HEP Baseline:  Goal status: INITIAL  2.  Increase FOTO to at least 68 Baseline: 52 Goal status: INITIAL  3.  Patient will be able to perform all his normal daily activities with no episode of back pain x 1 week. Baseline: up to 7/10 Goal status: INITIAL  4.  Patient will walk on level and unlevel surfaces x at least 800' I with no C/O pain in back Baseline:  Goal status: INITIAL  5.  Up and down a flight of steps, step over step, with no pain or unsteadiness. Baseline:  Goal status: INITIAL  PLAN:  PT FREQUENCY: 1-2x/week  PT DURATION: 12 weeks  PLANNED INTERVENTIONS: Therapeutic exercises, Therapeutic activity, Neuromuscular re-education, Balance training, Gait training, Patient/Family education, Self Care, Joint mobilization, Stair training, Dry Needling,  Electrical stimulation, Spinal manipulation, Cryotherapy, Moist heat, Ionotophoresis 4mg /ml Dexamethasone, and Manual therapy.  PLAN FOR NEXT SESSION: STM and stretch to low back, update HEP with strengthening for trunk and hips.    Juddson Cobern,ANGIE, PTA 12/24/2022, 8:40 AM  [facility] [address] [facility]. [place], [place], [address] Phone: [phone]   Fax:  [phone]

## 2022-12-29 ENCOUNTER — Ambulatory Visit: Payer: Medicare Other | Admitting: Physical Therapy

## 2022-12-29 ENCOUNTER — Encounter: Payer: Self-pay | Admitting: Physical Therapy

## 2022-12-29 DIAGNOSIS — R2681 Unsteadiness on feet: Secondary | ICD-10-CM

## 2022-12-29 DIAGNOSIS — M6281 Muscle weakness (generalized): Secondary | ICD-10-CM

## 2022-12-29 DIAGNOSIS — M5459 Other low back pain: Secondary | ICD-10-CM

## 2022-12-29 DIAGNOSIS — R293 Abnormal posture: Secondary | ICD-10-CM

## 2022-12-29 NOTE — Therapy (Signed)
OUTPATIENT PHYSICAL THERAPY THORACOLUMBAR TREATMENT   Patient Name: [name] MRN: [id number] DOB:[birth year], 72 y.o., male Today's Date: 12/29/2022  END OF SESSION:  PT End of Session - 12/29/22 0850     Visit Number 5    Date for PT Re-Evaluation 03/05/23    PT Start Time 0845    PT Stop Time 0925    PT Time Calculation (min) 40 min    Activity Tolerance Patient tolerated treatment well    Behavior During Therapy [facility] for tasks assessed/performed             Past Medical History:  Diagnosis Date   Hypertension    Past Surgical History:  Procedure Laterality Date   BACK SURGERY  2016   Dr. Nicanor Bake SURGERY  2020   Dr. Franky Macho   TOE SURGERY Left    has had pin placed and removed   TONSILLECTOMY     as a child   Patient Active Problem List   Diagnosis Date Noted   Lumbar adjacent segment disease with spondylolisthesis 05/15/2022   Lumbar stenosis with neurogenic claudication 06/15/2019   Spondylolisthesis of lumbar region 05/27/2013    PCP: Duane Lope  REFERRING PROVIDER: Coletta Memos, MD  REFERRING DIAG:  Diagnosis  [phone] (ICD-10-CM) - Spinal stenosis, lumbar region with neurogenic claudication    Rationale for Evaluation and Treatment: Rehabilitation  THERAPY DIAG:  Abnormal posture  Muscle weakness (generalized)  Other low back pain  Unsteadiness on feet  ONSET DATE: 12/04/22  SUBJECTIVE:                                                                                                                                                                                           SUBJECTIVE STATEMENT: Doing okay  PERTINENT HISTORY:  05/16/22-L2-3 fusion, L2 laminectomy  PAIN:  Are you having pain? Yes: NPRS scale: 3/10 Pain location: low back Pain description: nagging tooth ache, catches him and throws him off balance Aggravating factors: No pattern, except when he sneezes or coughs Relieving factors: Can't identify  anything  PRECAUTIONS: None  WEIGHT BEARING RESTRICTIONS: No  FALLS:  Has patient fallen in last 6 months? 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  NEXT MD VISIT: about 12/27/22  OBJECTIVE:   DIAGNOSTIC FINDINGS:  CT 11/27/22 IMPRESSION: 1.  Interval discectomy and fusion procedure at the L2-3 level. Hardware components appear well positioned. Considerable lucency surrounding the L2 pedicle screws consistent with ongoing motion. Minimal subsidence of the interbody spacers. Apparent sufficient patency of the canal and foramina. 2. Previous posterior decompression, diskectomy and fusion procedure from L3 through L5 with solid union and sufficient patency of the canal and foramina. 3. L5-S1 chronic disc degeneration with vacuum phenomenon. Endplate osteophytes and bulging of the disc more prominent towards the right. Mild bilateral foraminal stenosis with some potential to affect the exiting L5 nerves. Stenosis of the subarticular lateral recesses, right more than left, that could compress either S1 nerve, particularly the right. 4. Aortic atherosclerosis. Maximal measured diameter 3.0 cm.  PATIENT SURVEYS:  FOTO 58.2  SCREENING FOR RED FLAGS: Bowel or bladder incontinence: No Spinal tumors: No Cauda equina syndrome: No Compression fracture: No Abdominal aneurysm: No  COGNITION: Overall cognitive status: Within functional limits for tasks assessed     SENSATION: WFL  MUSCLE LENGTH: Hamstrings: Right 68 deg; Left 49 deg Thomas test: WFL B  POSTURE: forward head, decreased lumbar lordosis, and decreased thoracic  kyphosis  PALPATION: No real TTP, but lower back muscles very tight B.  LUMBAR ROM:   AROM eval  Flexion Mid shin P  Extension 50%  Right lateral flexion 3" above knee P  Left lateral flexion 3" above knee P  Right rotation 60%  Left rotation 90%   (Blank rows = not tested)  LOWER EXTREMITY ROM: B hip tightness in all planes.  LOWER EXTREMITY MMT:  B hips 4-/5, otherwise WNL  LUMBAR SPECIAL TESTS:  Straight leg raise test: Negative, Slump test: Negative, and FABER test: Positive  FUNCTIONAL TESTS:  5 times sit to stand: 10.2 WNL  GAIT: Distance walked: In clinic distances Assistive device utilized: None Level of assistance: Complete Independence Comments: Patient unsteady upon first standing in lobby, but otherwise no issues.  TODAY'S TREATMENT:                                                                                                                              DATE:  12/29/22 Bike L4 x 6 minutes, educated to keeping knees in line iwht hips to avoid knee pain. Encouraged to speak up if his knee started to hurt. He did report mild L medial knee pain after completing the warm up. Supine SKTC, DKTC R side lying STM to L lower Thoracic paraspinals Deep Tissue mobs to L Lower Th paraspinals Thoracic mobilization- thread the needle with hands on mat. Attempted  Cat/Cow, but his lower back is very stiff due to multi-level fusions Supine clamshells and bridge with G tband at knees Standing shoulder ext, rows, ER   12/24/22 Nustep L 5 - cued to slow speed as he tends to go very fast, some medial left knee issues after Rows & Lats 25# 2x10 Black tband trunk flex and ext 20  S2S OHP yellow ball 2x10 Green tband  stadning shld ext,row,ER and horz abd 15 x each Ppt with cuing- very difficult for pt to engage Supine green tband clams and hip flex with ppt 20 x LE on Pball bridges, oblq, K2C.iso abdominals PROM HS, Piriformis, Single K2C, ITB  12/22/22 UBE L2 x 2 min  each S2S OHP yellow ball 2 x10 Bike L2 x 5 min  Rows & Lats 20lb 2x10 S2S OHP yellow ball 2x10 Horiz shoulder abd green 2x10 LE on Pball bridges, oblq, K2C Passibe HS, Piriformis, Single K2C  12/16/22 NuStep L 5 x 6 min Shoulder Ext 5lb 2x10 Standing rows 10lb 2x10 Hamstring curls 20lb 2x10 Leg Ext 5lb 2x10 S2S OHP red ball x5, x10 Bridges x10 Passibe HS, Piriformis, Single K2C  12/11/22 Education Stretch in sit for HS, ITB, gluts, demonstrated supine SKTC, DKTC   PATIENT EDUCATION:  Education details: POC Person educated: Patient Education method: Explanation Education comprehension: verbalized understanding  HOME EXERCISE PROGRAM: [id number]  ASSESSMENT:  CLINICAL IMPRESSION: Patient reports an area in lower L Thoracic paraspinals which still gets very tight. Found an area in spasm and provided deep tissue mobs. He may benefit from DN, educated him and will ask on next visit. Followed STM with stretch and then trunk stabilization activities to better control pain. He requies frequent cues to slow down and not to push too hard.  OBJECTIVE IMPAIRMENTS: decreased activity tolerance, decreased balance, decreased endurance, difficulty walking, decreased ROM, decreased strength, impaired flexibility, improper body mechanics, postural dysfunction, and pain.   ACTIVITY LIMITATIONS: carrying, lifting, bending, sitting, standing, squatting, and locomotion level  PARTICIPATION LIMITATIONS: meal prep, cleaning, driving, shopping, and community activity  PERSONAL FACTORS: Age and Past/current experiences are also affecting patient's functional outcome.   REHAB POTENTIAL: Good  CLINICAL DECISION MAKING: Evolving/moderate complexity  EVALUATION COMPLEXITY: Moderate   GOALS: Goals reviewed with patient? Yes  SHORT TERM GOALS: Target date: 12/23/22  I with initial HEP Baseline: Goal status: met 12/24/22  LONG TERM GOALS: Target date: 03/05/23  I with final HEP Baseline:  Goal  status: INITIAL  2.  Increase FOTO to at least 68 Baseline: 52 Goal status: INITIAL  3.  Patient will be able to perform all his normal daily activities with no episode of back pain x 1 week. Baseline: up to 7/10 Goal status: 12/29/22- Still interfering at times-ongoing  4.  Patient will walk on level and unlevel surfaces x at least 800' I with no C/O pain in back Baseline:  Goal status: INITIAL  5.  Up and down a flight of steps, step over step, with no pain or unsteadiness. Baseline:  Goal status: INITIAL  PLAN:  PT FREQUENCY: 1-2x/week  PT DURATION: 12 weeks  PLANNED INTERVENTIONS: Therapeutic exercises, Therapeutic activity, Neuromuscular re-education, Balance training, Gait training, Patient/Family education, Self Care, Joint mobilization, Stair training, Dry Needling, Electrical stimulation, Spinal manipulation, Cryotherapy, Moist heat, Ionotophoresis 4mg /ml Dexamethasone, and Manual therapy.  PLAN FOR NEXT SESSION: DN for spasm in lower Thoracic paraspinals, update HEP with strengthening for trunk and hips.  Oley Balm DPT 12/29/22 10:55 AM   [place]  [facility] [address] [place], [place], [address] Phone: [phone]   Fax:  [phone]

## 2022-12-31 ENCOUNTER — Ambulatory Visit: Payer: Medicare Other | Admitting: Physical Therapy

## 2022-12-31 DIAGNOSIS — M6281 Muscle weakness (generalized): Secondary | ICD-10-CM

## 2022-12-31 DIAGNOSIS — R293 Abnormal posture: Secondary | ICD-10-CM

## 2022-12-31 DIAGNOSIS — M5459 Other low back pain: Secondary | ICD-10-CM | POA: Diagnosis not present

## 2022-12-31 DIAGNOSIS — R2681 Unsteadiness on feet: Secondary | ICD-10-CM | POA: Diagnosis not present

## 2022-12-31 NOTE — Therapy (Signed)
OUTPATIENT PHYSICAL THERAPY THORACOLUMBAR TREATMENT   Patient Name: [name] MRN: [id number] DOB:1951-07-20, 72 y.o., male Today's Date: 12/31/2022  END OF SESSION:  PT End of Session - 12/31/22 0845     Visit Number 6    Date for PT Re-Evaluation 03/05/23    PT Start Time 0845    PT Stop Time 0925    PT Time Calculation (min) 40 min             Past Medical History:  Diagnosis Date   Hypertension    Past Surgical History:  Procedure Laterality Date   BACK SURGERY  2016   Dr. Nicanor Bake SURGERY  2020   Dr. Franky Macho   TOE SURGERY Left    has had pin placed and removed   TONSILLECTOMY     as a child   Patient Active Problem List   Diagnosis Date Noted   Lumbar adjacent segment disease with spondylolisthesis 05/15/2022   Lumbar stenosis with neurogenic claudication 06/15/2019   Spondylolisthesis of lumbar region 05/27/2013    PCP: Duane Lope  REFERRING PROVIDER: Coletta Memos, MD  REFERRING DIAG:  Diagnosis  [phone] (ICD-10-CM) - Spinal stenosis, lumbar region with neurogenic claudication    Rationale for Evaluation and Treatment: Rehabilitation  THERAPY DIAG:  Abnormal posture  Muscle weakness (generalized)  Other low back pain  ONSET DATE: 12/04/22  SUBJECTIVE:                                                                                                                                                                                           SUBJECTIVE STATEMENT: Really found the spot last time so better but she mentioned DN  PERTINENT HISTORY:  05/16/22-L2-3 fusion, L2 laminectomy  PAIN:  Are you having pain? Yes: NPRS scale: 3/10 Pain location: low back Pain description: nagging tooth ache, catches him and throws him off balance Aggravating factors: No pattern, except when he sneezes or coughs Relieving factors: Can't identify anything  PRECAUTIONS: None  WEIGHT BEARING RESTRICTIONS: No  FALLS:  Has patient fallen in last 6  months? No  LIVING ENVIRONMENT: Lives with: lives with their family Lives in: House/apartment Stairs: Yes: Internal: 14 steps; on left going up and External: 3 steps; bilateral but cannot reach both Has following equipment at home: None  OCCUPATION: Retired, ride bike 92m/day, yard work  PLOF: Independent  PATIENT GOALS: Decrease pain to allow him to return to his normal routine.  NEXT MD VISIT: about 12/27/22  OBJECTIVE:   DIAGNOSTIC FINDINGS:  CT 11/27/22 IMPRESSION: 1. Interval discectomy and fusion procedure at the L2-3 level. Hardware components appear well  positioned. Considerable lucency surrounding the L2 pedicle screws consistent with ongoing motion. Minimal subsidence of the interbody spacers. Apparent sufficient patency of the canal and foramina. 2. Previous posterior decompression, diskectomy and fusion procedure from L3 through L5 with solid union and sufficient patency of the canal and foramina. 3. L5-S1 chronic disc degeneration with vacuum phenomenon. Endplate osteophytes and bulging of the disc more prominent towards the right. Mild bilateral foraminal stenosis with some potential to affect the exiting L5 nerves. Stenosis of the subarticular lateral recesses, right more than left, that could compress either S1 nerve, particularly the right. 4. Aortic atherosclerosis. Maximal measured diameter 3.0 cm.  PATIENT SURVEYS:  FOTO 58.2  SCREENING FOR RED FLAGS: Bowel or bladder incontinence: No Spinal tumors: No Cauda equina syndrome: No Compression fracture: No Abdominal aneurysm: No  COGNITION: Overall cognitive status: Within functional limits for tasks assessed     SENSATION: WFL  MUSCLE LENGTH: Hamstrings: Right 68 deg; Left 49 deg Thomas test: WFL B  POSTURE: forward head, decreased lumbar lordosis, and decreased thoracic kyphosis  PALPATION: No real TTP, but lower back muscles very tight B.  LUMBAR ROM:   AROM eval  Flexion Mid shin P   Extension 50%  Right lateral flexion 3" above knee P  Left lateral flexion 3" above knee P  Right rotation 60%  Left rotation 90%   (Blank rows = not tested)  LOWER EXTREMITY ROM: B hip tightness in all planes.  LOWER EXTREMITY MMT:  B hips 4-/5, otherwise WNL  LUMBAR SPECIAL TESTS:  Straight leg raise test: Negative, Slump test: Negative, and FABER test: Positive  FUNCTIONAL TESTS:  5 times sit to stand: 10.2 WNL  GAIT: Distance walked: In clinic distances Assistive device utilized: None Level of assistance: Complete Independence Comments: Patient unsteady upon first standing in lobby, but otherwise no issues.  TODAY'S TREATMENT:                                                                                                                              DATE:   12/31/22 DN Left T/L by Madalyn Rob PT with minimal reaction  followed by DSTW and TPR and had multi spots release IFC and MH after to relax area   12/29/22 Bike L4 x 6 minutes, educated to keeping knees in line iwht hips to avoid knee pain. Encouraged to speak up if his knee started to hurt. He did report mild L medial knee pain after completing the warm up. Supine SKTC, DKTC R side lying STM to L lower Thoracic paraspinals Deep Tissue mobs to L Lower Th paraspinals Thoracic mobilization- thread the needle with hands on mat. Attempted  Cat/Cow, but his lower back is very stiff due to multi-level fusions Supine clamshells and bridge with G tband at knees Standing shoulder ext, rows, ER   12/24/22 Nustep L 5 - cued to slow speed as he tends to go very fast, some medial left knee issues after Rows &  Lats 25# 2x10 Black tband trunk flex and ext 20  S2S OHP yellow ball 2x10 Green tband stadning shld ext,row,ER and horz abd 15 x each Ppt with cuing- very difficult for pt to engage Supine green tband clams and hip flex with ppt 20 x LE on Pball bridges, oblq, K2C.iso abdominals PROM HS, Piriformis, Single K2C,  ITB  12/22/22 UBE L2 x 2 min each S2S OHP yellow ball 2 x10 Bike L2 x 5 min  Rows & Lats 20lb 2x10 S2S OHP yellow ball 2x10 Horiz shoulder abd green 2x10 LE on Pball bridges, oblq, K2C Passibe HS, Piriformis, Single K2C  12/16/22 NuStep L 5 x 6 min Shoulder Ext 5lb 2x10 Standing rows 10lb 2x10 Hamstring curls 20lb 2x10 Leg Ext 5lb 2x10 S2S OHP red ball x5, x10 Bridges x10 Passibe HS, Piriformis, Single K2C  12/11/22 Education Stretch in sit for HS, ITB, gluts, demonstrated supine SKTC, DKTC   PATIENT EDUCATION:  Education details: POC Person educated: Patient Education method: Explanation Education comprehension: verbalized understanding  HOME EXERCISE PROGRAM: [id number]  ASSESSMENT:  CLINICAL IMPRESSION: Patient reports STW last session was helpful. Tried DN and NE but did response to DSTW an TPR  that we followed by estim to relax are. Goals assessed OBJECTIVE IMPAIRMENTS: decreased activity tolerance, decreased balance, decreased endurance, difficulty walking, decreased ROM, decreased strength, impaired flexibility, improper body mechanics, postural dysfunction, and pain.   ACTIVITY LIMITATIONS: carrying, lifting, bending, sitting, standing, squatting, and locomotion level  PARTICIPATION LIMITATIONS: meal prep, cleaning, driving, shopping, and community activity  PERSONAL FACTORS: Age and Past/current experiences are also affecting patient's functional outcome.   REHAB POTENTIAL: Good  CLINICAL DECISION MAKING: Evolving/moderate complexity  EVALUATION COMPLEXITY: Moderate   GOALS: Goals reviewed with patient? Yes  SHORT TERM GOALS: Target date: 12/23/22  I with initial HEP Baseline: Goal status: met 12/24/22  LONG TERM GOALS: Target date: 03/05/23  I with final HEP Baseline:  Goal status: INITIAL  2.  Increase FOTO to at least 68 Baseline: 52 Goal status: INITIAL  3.  Patient will be able to perform all his normal daily activities with no episode of  back pain x 1 week. Baseline: up to 7/10 Goal status: 12/29/22- Still interfering at times-ongoing  4.  Patient will walk on level and unlevel surfaces x at least 800' I with no C/O pain in back Baseline:  Goal status: on going 12/31/22  5.  Up and down a flight of steps, step over step, with no pain or unsteadiness. Baseline:  Goal status: 12/31/22 on going  PLAN:  PT FREQUENCY: 1-2x/week  PT DURATION: 12 weeks  PLANNED INTERVENTIONS: Therapeutic exercises, Therapeutic activity, Neuromuscular re-education, Balance training, Gait training, Patient/Family education, Self Care, Joint mobilization, Stair training, Dry Needling, Electrical stimulation, Spinal manipulation, Cryotherapy, Moist heat, Ionotophoresis 4mg /ml Dexamethasone, and Manual therapy.  PLAN FOR NEXT SESSION: DSTW spasm in lower Thoracic paraspinals, update HEP with strengthening for trunk and hips.   Patient Details  Name: [name] MRN: [id number] Date of Birth: 03-01-1951 Referring Provider:  Coletta Memos, MD  Encounter Date: 12/31/2022   Suanne Marker, PTA 12/31/2022, 8:45 AM  [place] [facility] [address] [place], [place], [address] Phone: [phone]   Fax:  [phone]

## 2023-01-05 ENCOUNTER — Ambulatory Visit: Payer: Medicare Other | Admitting: Physical Therapy

## 2023-01-05 DIAGNOSIS — M5459 Other low back pain: Secondary | ICD-10-CM | POA: Diagnosis not present

## 2023-01-05 DIAGNOSIS — R293 Abnormal posture: Secondary | ICD-10-CM

## 2023-01-05 DIAGNOSIS — R2681 Unsteadiness on feet: Secondary | ICD-10-CM | POA: Diagnosis not present

## 2023-01-05 DIAGNOSIS — M6281 Muscle weakness (generalized): Secondary | ICD-10-CM | POA: Diagnosis not present

## 2023-01-05 NOTE — Therapy (Signed)
OUTPATIENT PHYSICAL THERAPY THORACOLUMBAR TREATMENT   Patient Name: [name] MRN: [id number] DOB:1950-12-28, 72 y.o., male Today's Date: 01/05/2023  END OF SESSION:  PT End of Session - 01/05/23 0904     Visit Number 7    Date for PT Re-Evaluation 03/05/23    PT Start Time 0845    PT Stop Time 0930    PT Time Calculation (min) 45 min             Past Medical History:  Diagnosis Date   Hypertension    Past Surgical History:  Procedure Laterality Date   BACK SURGERY  2016   Dr. Nicanor Bake SURGERY  2020   Dr. Franky Macho   TOE SURGERY Left    has had pin placed and removed   TONSILLECTOMY     as a child   Patient Active Problem List   Diagnosis Date Noted   Lumbar adjacent segment disease with spondylolisthesis 05/15/2022   Lumbar stenosis with neurogenic claudication 06/15/2019   Spondylolisthesis of lumbar region 05/27/2013    PCP: Duane Lope  REFERRING PROVIDER: Coletta Memos, MD  REFERRING DIAG:  Diagnosis  [phone] (ICD-10-CM) - Spinal stenosis, lumbar region with neurogenic claudication    Rationale for Evaluation and Treatment: Rehabilitation  THERAPY DIAG:  Abnormal posture  Muscle weakness (generalized)  Other low back pain  ONSET DATE: 12/04/22  SUBJECTIVE:                                                                                                                                                                                           SUBJECTIVE STATEMENT: Very aware of spot. No energy after last session  PERTINENT HISTORY:  05/16/22-L2-3 fusion, L2 laminectomy  PAIN:  Are you having pain? Yes: NPRS scale: 3/10 Pain location: low back Pain description: nagging tooth ache, catches him and throws him off balance Aggravating factors: No pattern, except when he sneezes or coughs Relieving factors: Can't identify anything  PRECAUTIONS: None  WEIGHT BEARING RESTRICTIONS: No  FALLS:  Has patient fallen in last 6 months?  No  LIVING ENVIRONMENT: Lives with: lives with their family Lives in: House/apartment Stairs: Yes: Internal: 14 steps; on left going up and External: 3 steps; bilateral but cannot reach both Has following equipment at home: None  OCCUPATION: Retired, ride bike 78m/day, yard work  PLOF: Independent  PATIENT GOALS: Decrease pain to allow him to return to his normal routine.  NEXT MD VISIT: about 12/27/22  OBJECTIVE:   DIAGNOSTIC FINDINGS:  CT 11/27/22 IMPRESSION: 1. Interval discectomy and fusion procedure at the L2-3 level. Hardware components appear well positioned. Considerable lucency  surrounding the L2 pedicle screws consistent with ongoing motion. Minimal subsidence of the interbody spacers. Apparent sufficient patency of the canal and foramina. 2. Previous posterior decompression, diskectomy and fusion procedure from L3 through L5 with solid union and sufficient patency of the canal and foramina. 3. L5-S1 chronic disc degeneration with vacuum phenomenon. Endplate osteophytes and bulging of the disc more prominent towards the right. Mild bilateral foraminal stenosis with some potential to affect the exiting L5 nerves. Stenosis of the subarticular lateral recesses, right more than left, that could compress either S1 nerve, particularly the right. 4. Aortic atherosclerosis. Maximal measured diameter 3.0 cm.  PATIENT SURVEYS:  FOTO 58.2  SCREENING FOR RED FLAGS: Bowel or bladder incontinence: No Spinal tumors: No Cauda equina syndrome: No Compression fracture: No Abdominal aneurysm: No  COGNITION: Overall cognitive status: Within functional limits for tasks assessed     SENSATION: WFL  MUSCLE LENGTH: Hamstrings: Right 68 deg; Left 49 deg Thomas test: WFL B  POSTURE: forward head, decreased lumbar lordosis, and decreased thoracic kyphosis  PALPATION: No real TTP, but lower back muscles very tight B.  LUMBAR ROM:   AROM eval  Flexion Mid shin P  Extension  50%  Right lateral flexion 3" above knee P  Left lateral flexion 3" above knee P  Right rotation 60%  Left rotation 90%   (Blank rows = not tested)  LOWER EXTREMITY ROM: B hip tightness in all planes.  LOWER EXTREMITY MMT:  B hips 4-/5, otherwise WNL  LUMBAR SPECIAL TESTS:  Straight leg raise test: Negative, Slump test: Negative, and FABER test: Positive  FUNCTIONAL TESTS:  5 times sit to stand: 10.2 WNL  GAIT: Distance walked: In clinic distances Assistive device utilized: None Level of assistance: Complete Independence Comments: Patient unsteady upon first standing in lobby, but otherwise no issues.  TODAY'S TREATMENT:                                                                                                                              DATE:   01/05/23 DSTW to lateral iliac crest, iliac crest distraction and PROM LE and trunk  DN again as pt is very tender and spasms with STW- performed by MAlbright PT ( DN above iliac crest) KTape tape over area to help increase blood flow and increase circulation    12/31/22 DN Left T/L by MAlbright PT with minimal reaction  followed by DSTW and TPR and had multi spots release IFC and MH after to relax area   12/29/22 Bike L4 x 6 minutes, educated to keeping knees in line iwht hips to avoid knee pain. Encouraged to speak up if his knee started to hurt. He did report mild L medial knee pain after completing the warm up. Supine SKTC, DKTC R side lying STM to L lower Thoracic paraspinals Deep Tissue mobs to L Lower Th paraspinals Thoracic mobilization- thread the needle with hands on mat. Attempted  Cat/Cow, but his lower back  is very stiff due to multi-level fusions Supine clamshells and bridge with G tband at knees Standing shoulder ext, rows, ER   12/24/22 Nustep L 5 - cued to slow speed as he tends to go very fast, some medial left knee issues after Rows & Lats 25# 2x10 Black tband trunk flex and ext 20  S2S OHP yellow ball  2x10 Green tband stadning shld ext,row,ER and horz abd 15 x each Ppt with cuing- very difficult for pt to engage Supine green tband clams and hip flex with ppt 20 x LE on Pball bridges, oblq, K2C.iso abdominals PROM HS, Piriformis, Single K2C, ITB  12/22/22 UBE L2 x 2 min each S2S OHP yellow ball 2 x10 Bike L2 x 5 min  Rows & Lats 20lb 2x10 S2S OHP yellow ball 2x10 Horiz shoulder abd green 2x10 LE on Pball bridges, oblq, K2C Passibe HS, Piriformis, Single K2C  12/16/22 NuStep L 5 x 6 min Shoulder Ext 5lb 2x10 Standing rows 10lb 2x10 Hamstring curls 20lb 2x10 Leg Ext 5lb 2x10 S2S OHP red ball x5, x10 Bridges x10 Passibe HS, Piriformis, Single K2C  12/11/22 Education Stretch in sit for HS, ITB, gluts, demonstrated supine SKTC, DKTC   PATIENT EDUCATION:  Education details: POC Person educated: Patient Education method: Explanation Education comprehension: verbalized understanding  HOME EXERCISE PROGRAM: [id number]  ASSESSMENT:  CLINICAL IMPRESSION: pt arrived in very aware of left lateral LB pain, states no energy after last session. Very tender over iliac crest so STW, stretching and DN with trial of KTape  OBJECTIVE IMPAIRMENTS: decreased activity tolerance, decreased balance, decreased endurance, difficulty walking, decreased ROM, decreased strength, impaired flexibility, improper body mechanics, postural dysfunction, and pain.   ACTIVITY LIMITATIONS: carrying, lifting, bending, sitting, standing, squatting, and locomotion level  PARTICIPATION LIMITATIONS: meal prep, cleaning, driving, shopping, and community activity  PERSONAL FACTORS: Age and Past/current experiences are also affecting patient's functional outcome.   REHAB POTENTIAL: Good  CLINICAL DECISION MAKING: Evolving/moderate complexity  EVALUATION COMPLEXITY: Moderate   GOALS: Goals reviewed with patient? Yes  SHORT TERM GOALS: Target date: 12/23/22  I with initial HEP Baseline: Goal status: met  12/24/22  LONG TERM GOALS: Target date: 03/05/23  I with final HEP Baseline:  Goal status: INITIAL  2.  Increase FOTO to at least 68 Baseline: 52 Goal status: INITIAL  3.  Patient will be able to perform all his normal daily activities with no episode of back pain x 1 week. Baseline: up to 7/10 Goal status: 12/29/22- Still interfering at times-ongoing  4.  Patient will walk on level and unlevel surfaces x at least 800' I with no C/O pain in back Baseline:  Goal status: on going 12/31/22  5.  Up and down a flight of steps, step over step, with no pain or unsteadiness. Baseline:  Goal status: 12/31/22 on going  PLAN:  PT FREQUENCY: 1-2x/week  PT DURATION: 12 weeks  PLANNED INTERVENTIONS: Therapeutic exercises, Therapeutic activity, Neuromuscular re-education, Balance training, Gait training, Patient/Family education, Self Care, Joint mobilization, Stair training, Dry Needling, Electrical stimulation, Spinal manipulation, Cryotherapy, Moist heat, Ionotophoresis 4mg /ml Dexamethasone, and Manual therapy.  PLAN FOR NEXT SESSION: assess  Patient Details  Name: KAMAEHU OSMANSKI MRN: [id number] Date of Birth: 1951/07/02 Referring Provider:  Coletta Memos, MD  Encounter Date: 01/05/2023   Suanne Marker, PTA 01/05/2023, 9:05 AM  [facility]. [place], [place], [address] Phone: [phone]   Fax:  [phone] at  [place] [address] [place], [place], [address] Phone: [phone]   Fax:  [phone]  Patient Details  Name: HAIRO DOOLY MRN: [id number] Date of Birth: [birth year] Referring Provider:  Coletta Memos, MD  Encounter Date: 01/05/2023   Suanne Marker, PTA 01/05/2023, 9:04 AM  [facility] at [facility] [address] [facility] [place], [place], [address] Phone: [phone]   Fax:  [phone]

## 2023-01-12 ENCOUNTER — Ambulatory Visit: Payer: Medicare Other | Admitting: Physical Therapy

## 2023-01-12 DIAGNOSIS — R293 Abnormal posture: Secondary | ICD-10-CM | POA: Diagnosis not present

## 2023-01-12 DIAGNOSIS — M5459 Other low back pain: Secondary | ICD-10-CM | POA: Diagnosis not present

## 2023-01-12 DIAGNOSIS — R2681 Unsteadiness on feet: Secondary | ICD-10-CM | POA: Diagnosis not present

## 2023-01-12 DIAGNOSIS — M6281 Muscle weakness (generalized): Secondary | ICD-10-CM | POA: Diagnosis not present

## 2023-01-12 NOTE — Therapy (Signed)
OUTPATIENT PHYSICAL THERAPY THORACOLUMBAR TREATMENT   Patient Name: [name] MRN: [id number] DOB:[birth year], 72 y.o., male Today's Date: 01/12/2023  END OF SESSION:  PT End of Session - 01/12/23 0845     Visit Number 8    Date for PT Re-Evaluation 03/05/23    PT Start Time 0845    PT Stop Time 0930    PT Time Calculation (min) 45 min             Past Medical History:  Diagnosis Date   Hypertension    Past Surgical History:  Procedure Laterality Date   BACK SURGERY  2016   Dr. Nicanor Bake SURGERY  2020   Dr. Franky Macho   TOE SURGERY Left    has had pin placed and removed   TONSILLECTOMY     as a child   Patient Active Problem List   Diagnosis Date Noted   Lumbar adjacent segment disease with spondylolisthesis 05/15/2022   Lumbar stenosis with neurogenic claudication 06/15/2019   Spondylolisthesis of lumbar region 05/27/2013    PCP: Duane Lope  REFERRING PROVIDER: Coletta Memos, MD  REFERRING DIAG:  Diagnosis  [phone] (ICD-10-CM) - Spinal stenosis, lumbar region with neurogenic claudication    Rationale for Evaluation and Treatment: Rehabilitation  THERAPY DIAG:  Muscle weakness (generalized)  Other low back pain  ONSET DATE: 12/04/22  SUBJECTIVE:                                                                                                                                                                                           SUBJECTIVE STATEMENT: No adverse reaction after last session. Spot feels better,every now and then "twinge" of pain and then it goes away. Worked in garden all day yesterday  PERTINENT HISTORY:  05/16/22-L2-3 fusion, L2 laminectomy  PAIN:  Are you having pain? Yes: NPRS scale: 3/10 Pain location: low back Pain description: nagging tooth ache, catches him and throws him off balance Aggravating factors: No pattern, except when he sneezes or coughs Relieving factors: Can't identify anything  PRECAUTIONS:  None  WEIGHT BEARING RESTRICTIONS: No  FALLS:  Has patient fallen in last 6 months? No  LIVING ENVIRONMENT: Lives with: lives with their family Lives in: House/apartment Stairs: Yes: Internal: 14 steps; on left going up and External: 3 steps; bilateral but cannot reach both Has following equipment at home: None  OCCUPATION: Retired, ride bike 64m/day, yard work  PLOF: Independent  PATIENT GOALS: Decrease pain to allow him to return to his normal routine.  NEXT MD VISIT: about 12/27/22  OBJECTIVE:   DIAGNOSTIC FINDINGS:  CT 11/27/22 IMPRESSION: 1. Interval discectomy  and fusion procedure at the L2-3 level. Hardware components appear well positioned. Considerable lucency surrounding the L2 pedicle screws consistent with ongoing motion. Minimal subsidence of the interbody spacers. Apparent sufficient patency of the canal and foramina. 2. Previous posterior decompression, diskectomy and fusion procedure from L3 through L5 with solid union and sufficient patency of the canal and foramina. 3. L5-S1 chronic disc degeneration with vacuum phenomenon. Endplate osteophytes and bulging of the disc more prominent towards the right. Mild bilateral foraminal stenosis with some potential to affect the exiting L5 nerves. Stenosis of the subarticular lateral recesses, right more than left, that could compress either S1 nerve, particularly the right. 4. Aortic atherosclerosis. Maximal measured diameter 3.0 cm.  PATIENT SURVEYS:  FOTO 58.2  SCREENING FOR RED FLAGS: Bowel or bladder incontinence: No Spinal tumors: No Cauda equina syndrome: No Compression fracture: No Abdominal aneurysm: No  COGNITION: Overall cognitive status: Within functional limits for tasks assessed     SENSATION: WFL  MUSCLE LENGTH: Hamstrings: Right 68 deg; Left 49 deg Thomas test: WFL B  POSTURE: forward head, decreased lumbar lordosis, and decreased thoracic kyphosis  PALPATION: No real TTP, but lower  back muscles very tight B.  LUMBAR ROM:   AROM eval  Flexion Mid shin P  Extension 50%  Right lateral flexion 3" above knee P  Left lateral flexion 3" above knee P  Right rotation 60%  Left rotation 90%   (Blank rows = not tested)  LOWER EXTREMITY ROM: B hip tightness in all planes.  LOWER EXTREMITY MMT:  B hips 4-/5, otherwise WNL  LUMBAR SPECIAL TESTS:  Straight leg raise test: Negative, Slump test: Negative, and FABER test: Positive  FUNCTIONAL TESTS:  5 times sit to stand: 10.2 WNL  GAIT: Distance walked: In clinic distances Assistive device utilized: None Level of assistance: Complete Independence Comments: Patient unsteady upon first standing in lobby, but otherwise no issues.  TODAY'S TREATMENT:                                                                                                                              DATE:   01/12/23 UBE L 3 2 min fwd/2 min backward Cable pulley shld ext 10# 2 sets 10 Cable pulley AR press, circles and rotation 10 each direction each side 10# 8# lateral flexion 15x BIL Lat Pull Down and Seated Row 25 # 2 sets 10 Supine feet on ball bridge, KTC and obl 15x Iso abdominals with ball 15x hold 3 sec PROM LE and trunk - less tightness noted but still tight throughout and stressed need to stretch daily Still very isolated TP left side ( he felt DN was less painful then STW and KT tape helpd0 DN MAlbright PT KTape tape over area to help increase blood flow and increase circulation    01/05/23 DSTW to lateral iliac crest, iliac crest distraction and PROM LE and trunk  DN again as pt is very tender and spasms with  STW- performed by MAlbright PT ( DN above iliac crest) KTape tape over area to help increase blood flow and increase circulation    12/31/22 DN Left T/L by MAlbright PT with minimal reaction  followed by DSTW and TPR and had multi spots release IFC and MH after to relax area   12/29/22 Bike L4 x 6 minutes, educated to  keeping knees in line iwht hips to avoid knee pain. Encouraged to speak up if his knee started to hurt. He did report mild L medial knee pain after completing the warm up. Supine SKTC, DKTC R side lying STM to L lower Thoracic paraspinals Deep Tissue mobs to L Lower Th paraspinals Thoracic mobilization- thread the needle with hands on mat. Attempted  Cat/Cow, but his lower back is very stiff due to multi-level fusions Supine clamshells and bridge with G tband at knees Standing shoulder ext, rows, ER   12/24/22 Nustep L 5 - cued to slow speed as he tends to go very fast, some medial left knee issues after Rows & Lats 25# 2x10 Black tband trunk flex and ext 20  S2S OHP yellow ball 2x10 Green tband stadning shld ext,row,ER and horz abd 15 x each Ppt with cuing- very difficult for pt to engage Supine green tband clams and hip flex with ppt 20 x LE on Pball bridges, oblq, K2C.iso abdominals PROM HS, Piriformis, Single K2C, ITB  12/22/22 UBE L2 x 2 min each S2S OHP yellow ball 2 x10 Bike L2 x 5 min  Rows & Lats 20lb 2x10 S2S OHP yellow ball 2x10 Horiz shoulder abd green 2x10 LE on Pball bridges, oblq, K2C Passibe HS, Piriformis, Single K2C  12/16/22 NuStep L 5 x 6 min Shoulder Ext 5lb 2x10 Standing rows 10lb 2x10 Hamstring curls 20lb 2x10 Leg Ext 5lb 2x10 S2S OHP red ball x5, x10 Bridges x10 Passibe HS, Piriformis, Single K2C  12/11/22 Education Stretch in sit for HS, ITB, gluts, demonstrated supine SKTC, DKTC   PATIENT EDUCATION:  Education details: POC Person educated: Patient Education method: Explanation Education comprehension: verbalized understanding  HOME EXERCISE PROGRAM: [id number]  ASSESSMENT:  CLINICAL IMPRESSION: pt arrived feeling better, states pain comes at times but then goes away quickly. Able to work in garden. Added back in some back strengthening ex and he tolerated well with postural cuing needed.pt is improving and progressing towards assessed  goals. OBJECTIVE IMPAIRMENTS: decreased activity tolerance, decreased balance, decreased endurance, difficulty walking, decreased ROM, decreased strength, impaired flexibility, improper body mechanics, postural dysfunction, and pain.   ACTIVITY LIMITATIONS: carrying, lifting, bending, sitting, standing, squatting, and locomotion level  PARTICIPATION LIMITATIONS: meal prep, cleaning, driving, shopping, and community activity  PERSONAL FACTORS: Age and Past/current experiences are also affecting patient's functional outcome.   REHAB POTENTIAL: Good  CLINICAL DECISION MAKING: Evolving/moderate complexity  EVALUATION COMPLEXITY: Moderate   GOALS: Goals reviewed with patient? Yes  SHORT TERM GOALS: Target date: 12/23/22  I with initial HEP Baseline: Goal status: met 12/24/22  LONG TERM GOALS: Target date: 03/05/23  I with final HEP Baseline:  Goal status: INITIAL  2.  Increase FOTO to at least 68 Baseline: 52 Goal status: INITIAL  3.  Patient will be able to perform all his normal daily activities with no episode of back pain x 1 week. Baseline: up to 7/10 Goal status: 12/29/22- Still interfering at times-ongoing. 01/12/23 improving and progressing  4.  Patient will walk on level and unlevel surfaces x at least 800' I with no C/O pain in back Baseline:  Goal status: on going 12/31/22   01/12/23 improving and progressing  5.  Up and down a flight of steps, step over step, with no pain or unsteadiness. Baseline:  Goal status: 12/31/22 on going 01/12/23 improving and progressing  PLAN:  PT FREQUENCY: 1-2x/week  PT DURATION: 12 weeks  PLANNED INTERVENTIONS: Therapeutic exercises, Therapeutic activity, Neuromuscular re-education, Balance training, Gait training, Patient/Family education, Self Care, Joint mobilization, Stair training, Dry Needling, Electrical stimulation, Spinal manipulation, Cryotherapy, Moist heat, Ionotophoresis 4mg /ml Dexamethasone, and Manual therapy.  PLAN FOR  NEXT SESSION: assess  Patient Details  Name: SENAD URBEN MRN: [id number] Date of Birth: 01/20/1951 Referring Provider:  Coletta Memos, MD  Encounter Date: 01/12/2023   Suanne Marker, PTA 01/12/2023, 8:46 AM  [facility] [address] [place], [place], [address] Phone: [phone]   Fax:  [phone] at [facility] [address] [facility] [address] [place], [place], [address] Phone: [phone]   Fax:  [phone]  Patient Details  Name: RITHY DORWARD MRN: [id number] Date of Birth: [birth year] Referring Provider:  Coletta Memos, MD  Encounter Date: 01/12/2023   Suanne Marker, PTA 01/12/2023, 8:46 AM  [facility] at [facility] [address] [facility] [place], [place], [address] Phone: [phone]   Fax:  [phone] at [facility] [address] [facility] [address] [place], [place], [address] Phone: [phone]   Fax:  [phone]  Patient Details  Name: JOJI KIRCHGESSNER MRN: [id number] Date of Birth: 09-Feb-1951 Referring Provider:  Coletta Memos, MD  Encounter Date: 01/12/2023   Suanne Marker, PTA 01/12/2023, 8:46 AM  [facility] at [facility] [address] [facility] [place], [place], [address] Phone: [phone]   Fax:  [phone]

## 2023-01-19 ENCOUNTER — Ambulatory Visit: Payer: Medicare Other | Admitting: Physical Therapy

## 2023-01-21 DIAGNOSIS — M48062 Spinal stenosis, lumbar region with neurogenic claudication: Secondary | ICD-10-CM | POA: Diagnosis not present

## 2023-01-28 ENCOUNTER — Ambulatory Visit: Payer: Medicare Other | Attending: Neurosurgery | Admitting: Physical Therapy

## 2023-01-28 DIAGNOSIS — M6281 Muscle weakness (generalized): Secondary | ICD-10-CM | POA: Insufficient documentation

## 2023-01-28 DIAGNOSIS — M5459 Other low back pain: Secondary | ICD-10-CM | POA: Insufficient documentation

## 2023-01-28 DIAGNOSIS — R2681 Unsteadiness on feet: Secondary | ICD-10-CM | POA: Insufficient documentation

## 2023-01-28 DIAGNOSIS — R293 Abnormal posture: Secondary | ICD-10-CM | POA: Diagnosis not present

## 2023-01-28 NOTE — Therapy (Signed)
OUTPATIENT PHYSICAL THERAPY THORACOLUMBAR TREATMENT   Patient Name: [name] MRN: [id number] DOB:02-17-1951, 72 y.o., male Today's Date: 01/28/2023  END OF SESSION:  PT End of Session - 01/28/23 0806     Visit Number 9    Date for PT Re-Evaluation 03/05/23    PT Start Time 0800    PT Stop Time 0845    PT Time Calculation (min) 45 min             Past Medical History:  Diagnosis Date   Hypertension    Past Surgical History:  Procedure Laterality Date   BACK SURGERY  2016   Dr. Nicanor Bake SURGERY  2020   Dr. Franky Macho   TOE SURGERY Left    has had pin placed and removed   TONSILLECTOMY     as a child   Patient Active Problem List   Diagnosis Date Noted   Lumbar adjacent segment disease with spondylolisthesis 05/15/2022   Lumbar stenosis with neurogenic claudication 06/15/2019   Spondylolisthesis of lumbar region 05/27/2013    PCP: Duane Lope  REFERRING PROVIDER: Coletta Memos, MD  REFERRING DIAG:  Diagnosis  [phone] (ICD-10-CM) - Spinal stenosis, lumbar region with neurogenic claudication    Rationale for Evaluation and Treatment: Rehabilitation  THERAPY DIAG:  Muscle weakness (generalized)  Other low back pain  Abnormal posture  ONSET DATE: 12/04/22  SUBJECTIVE:                                                                                                                                                                                           SUBJECTIVE STATEMENT: Ups and downs with back. Saw MD and he felt I should continue PT to get it all the way gone and felt I needed 2 x a week. Overall 70% better . Felt pop in RT knee yesterday going down steps  PERTINENT HISTORY:  05/16/22-L2-3 fusion, L2 laminectomy  PAIN:  Are you having pain? Yes: NPRS scale: 3/10 Pain location: low back Pain description: nagging tooth ache, catches him and throws him off balance Aggravating factors: No pattern, except when he sneezes or coughs Relieving  factors: Can't identify anything  PRECAUTIONS: None  WEIGHT BEARING RESTRICTIONS: No  FALLS:  Has patient fallen in last 6 months? 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  NEXT MD VISIT: about 12/27/22  OBJECTIVE:   DIAGNOSTIC FINDINGS:  CT 11/27/22 IMPRESSION: 1. Interval discectomy and fusion procedure at the L2-3 level. Hardware components appear well positioned. Considerable lucency surrounding the L2 pedicle screws consistent with ongoing motion. Minimal subsidence of the interbody spacers. Apparent sufficient patency of the canal and foramina. 2. Previous posterior decompression, diskectomy and fusion procedure from L3 through L5 with solid union and sufficient patency of the canal and foramina. 3. L5-S1 chronic disc degeneration with vacuum phenomenon. Endplate osteophytes and bulging of the disc more prominent towards the right. Mild bilateral foraminal stenosis with some potential to affect the exiting L5 nerves. Stenosis of the subarticular lateral recesses, right more than left, that could compress either S1 nerve, particularly the right. 4. Aortic atherosclerosis. Maximal measured diameter 3.0 cm.  PATIENT SURVEYS:  FOTO 58.2  SCREENING FOR RED FLAGS: Bowel or bladder incontinence: No Spinal tumors: No Cauda equina syndrome: No Compression fracture: No Abdominal aneurysm: No  COGNITION: Overall cognitive status: Within functional limits for tasks assessed     SENSATION: WFL  MUSCLE LENGTH: Hamstrings: Right 68 deg; Left 49 deg Thomas test: WFL B  POSTURE: forward head, decreased lumbar lordosis, and decreased thoracic  kyphosis  PALPATION: No real TTP, but lower back muscles very tight B.  LUMBAR ROM:   AROM eval 01/28/23  Flexion Mid shin P WFLS  Extension 50% Limited 50%  Right lateral flexion 3" above knee P Limited 50%  Left lateral flexion 3" above knee P Limited 50%  Right rotation 60%   Left rotation 90%    (Blank rows = not tested)  LOWER EXTREMITY ROM: B hip tightness in all planes.  LOWER EXTREMITY MMT:  B hips 4-/5, otherwise WNL  LUMBAR SPECIAL TESTS:  Straight leg raise test: Negative, Slump test: Negative, and FABER test: Positive  FUNCTIONAL TESTS:  5 times sit to stand: 10.2 WNL  GAIT: Distance walked: In clinic distances Assistive device utilized: None Level of assistance: Complete Independence Comments: Patient unsteady upon first standing in lobby, but otherwise no issues.  TODAY'S TREATMENT:                                                                                                                              DATE:   01/28/23 Assess lumbar ROM flex WFLS ,ext and lateral flexion decreased 50% MMT grossly tested hips/knees 4/5 LE tightness but improving UBE L 3 3 min each way Lat Pull Down and Seated Row 25 # 2 sets 12 Black Tband trunk ext 20x Cable pulley shld ext 15# 2 sets 10 Cable pulley AR press and circles 10 each 10# 8# lateral flexion 15 x each Supine feet on ball bridge, KTC and obl 15x Iso abdominals with ball 15x hold 3 sec STM with and without theragun to left LB KT tape left LB  01/12/23 UBE L 3 2 min fwd/2 min backward Cable pulley shld ext 10# 2 sets 10 Cable  pulley AR press, circles and rotation 10 each direction each side 10# 8# lateral flexion 15x BIL Lat Pull Down and Seated Row 25 # 2 sets 10 Supine feet on ball bridge, KTC and obl 15x Iso abdominals with ball 15x hold 3 sec PROM LE and trunk - less tightness noted but still tight throughout and stressed need to stretch daily Still very isolated TP left side ( he felt DN was less  painful then STW and KT tape helpd0 DN MAlbright PT KTape tape over area to help increase blood flow and increase circulation    01/05/23 DSTW to lateral iliac crest, iliac crest distraction and PROM LE and trunk  DN again as pt is very tender and spasms with STW- performed by MAlbright PT ( DN above iliac crest) KTape tape over area to help increase blood flow and increase circulation    12/31/22 DN Left T/L by MAlbright PT with minimal reaction  followed by DSTW and TPR and had multi spots release IFC and MH after to relax area   12/29/22 Bike L4 x 6 minutes, educated to keeping knees in line iwht hips to avoid knee pain. Encouraged to speak up if his knee started to hurt. He did report mild L medial knee pain after completing the warm up. Supine SKTC, DKTC R side lying STM to L lower Thoracic paraspinals Deep Tissue mobs to L Lower Th paraspinals Thoracic mobilization- thread the needle with hands on mat. Attempted  Cat/Cow, but his lower back is very stiff due to multi-level fusions Supine clamshells and bridge with G tband at knees Standing shoulder ext, rows, ER   12/24/22 Nustep L 5 - cued to slow speed as he tends to go very fast, some medial left knee issues after Rows & Lats 25# 2x10 Black tband trunk flex and ext 20  S2S OHP yellow ball 2x10 Green tband stadning shld ext,row,ER and horz abd 15 x each Ppt with cuing- very difficult for pt to engage Supine green tband clams and hip flex with ppt 20 x LE on Pball bridges, oblq, K2C.iso abdominals PROM HS, Piriformis, Single K2C, ITB  12/22/22 UBE L2 x 2 min each S2S OHP yellow ball 2 x10 Bike L2 x 5 min  Rows & Lats 20lb 2x10 S2S OHP yellow ball 2x10 Horiz shoulder abd green 2x10 LE on Pball bridges, oblq, K2C Passibe HS, Piriformis, Single K2C  12/16/22 NuStep L 5 x 6 min Shoulder Ext 5lb 2x10 Standing rows 10lb 2x10 Hamstring curls 20lb 2x10 Leg Ext 5lb 2x10 S2S OHP red ball x5, x10 Bridges x10 Passibe  HS, Piriformis, Single K2C  12/11/22 Education Stretch in sit for HS, ITB, gluts, demonstrated supine SKTC, DKTC   PATIENT EDUCATION:  Education details: POC Person educated: Patient Education method: Explanation Education comprehension: verbalized understanding  HOME EXERCISE PROGRAM: [id number]  ASSESSMENT:  CLINICAL IMPRESSION: pt reports 70% better,saw MD and he wants him to continue and increase to 2 x a week to get full improvement. Pt states pain is better but still can get bad with certain mvmt/actvities. Assessed ROM/MMT and goals as documented. OBJECTIVE IMPAIRMENTS: decreased activity tolerance, decreased balance, decreased endurance, difficulty walking, decreased ROM, decreased strength, impaired flexibility, improper body mechanics, postural dysfunction, and pain.   ACTIVITY LIMITATIONS: carrying, lifting, bending, sitting, standing, squatting, and locomotion level  PARTICIPATION LIMITATIONS: meal prep, cleaning, driving, shopping, and community activity  PERSONAL FACTORS: Age and Past/current experiences are also affecting patient's functional outcome.   REHAB POTENTIAL: Good  CLINICAL  DECISION MAKING: Evolving/moderate complexity  EVALUATION COMPLEXITY: Moderate   GOALS: Goals reviewed with patient? Yes  SHORT TERM GOALS: Target date: 12/23/22  I with initial HEP Baseline: Goal status: met 12/24/22  LONG TERM GOALS: Target date: 03/05/23  I with final HEP Baseline:  Goal status: INITIAL  2.  Increase FOTO to at least 68 Baseline: 52 Goal status: INITIAL  3.  Patient will be able to perform all his normal daily activities with no episode of back pain x 1 week. Baseline: up to 7/10 Goal status: 12/29/22- Still interfering at times-ongoing. 01/12/23 improving and progressing 01/28/23 progressing  4.  Patient will walk on level and unlevel surfaces x at least 800' I with no C/O pain in back Baseline:  Goal status: on going 12/31/22   01/12/23 improving and  progressing 01/28/23 progressing  5.  Up and down a flight of steps, step over step, with no pain or unsteadiness. Baseline:  Goal status: 12/31/22 on going 01/12/23 improving and progressing  6/6 24 progressing  PLAN:  PT FREQUENCY: 1-2x/week  PT DURATION: 12 weeks  PLANNED INTERVENTIONS: Therapeutic exercises, Therapeutic activity, Neuromuscular re-education, Balance training, Gait training, Patient/Family education, Self Care, Joint mobilization, Stair training, Dry Needling, Electrical stimulation, Spinal manipulation, Cryotherapy, Moist heat, Ionotophoresis 4mg /ml Dexamethasone, and Manual therapy.  PLAN FOR NEXT SESSION: core stab,flexibility. Increase freq to 2 x a week  Patient Details  Name: [name] MRN: [id number] Date of Birth: 1951-05-04 Referring Provider:  Coletta Memos, MD  Encounter Date: 01/28/2023   Suanne Marker, PTA 01/28/2023, 8:07 AM  [facility] [address] [place], [place], [address] Phone: [phone]   Fax:  [phone] [address] [place], [place], [address] Phone: [phone]   Fax:  [phone]  Patient Details  Name: [name] MRN: [id number] Date of Birth: 30-Jun-1951 Referring Provider:  Coletta Memos, MD  Encounter Date: 01/28/2023

## 2023-02-03 ENCOUNTER — Encounter: Payer: Self-pay | Admitting: Physical Therapy

## 2023-02-03 ENCOUNTER — Ambulatory Visit: Payer: Medicare Other | Admitting: Physical Therapy

## 2023-02-03 DIAGNOSIS — M5459 Other low back pain: Secondary | ICD-10-CM

## 2023-02-03 DIAGNOSIS — R293 Abnormal posture: Secondary | ICD-10-CM | POA: Diagnosis not present

## 2023-02-03 DIAGNOSIS — R2681 Unsteadiness on feet: Secondary | ICD-10-CM | POA: Diagnosis not present

## 2023-02-03 DIAGNOSIS — M6281 Muscle weakness (generalized): Secondary | ICD-10-CM

## 2023-02-03 NOTE — Therapy (Signed)
OUTPATIENT PHYSICAL THERAPY THORACOLUMBAR TREATMENT  Progress Note Reporting Period 12/11/22 to 02/03/23  See note below for Objective Data and Assessment of Progress/Goals.     Patient Name: [name] MRN: [id number] DOB:08/15/1951, 72 y.o., male Today's Date: 02/03/2023  END OF SESSION:  PT End of Session - 02/03/23 0848     Visit Number 10    Date for PT Re-Evaluation 03/05/23    PT Start Time 0845    PT Stop Time 0930    PT Time Calculation (min) 45 min    Activity Tolerance Patient tolerated treatment well    Behavior During Therapy [facility] for tasks assessed/performed             Past Medical History:  Diagnosis Date   Hypertension    Past Surgical History:  Procedure Laterality Date   BACK SURGERY  2016   Dr. Nicanor Bake SURGERY  2020   Dr. Franky Macho   TOE SURGERY Left    has had pin placed and removed   TONSILLECTOMY     as a child   Patient Active Problem List   Diagnosis Date Noted   Lumbar adjacent segment disease with spondylolisthesis 05/15/2022   Lumbar stenosis with neurogenic claudication 06/15/2019   Spondylolisthesis of lumbar region 05/27/2013    PCP: Duane Lope  REFERRING PROVIDER: Coletta Memos, MD  REFERRING DIAG:  Diagnosis  [phone] (ICD-10-CM) - Spinal stenosis, lumbar region with neurogenic claudication    Rationale for Evaluation and Treatment: Rehabilitation  THERAPY DIAG:  Muscle weakness (generalized)  Other low back pain  Unsteadiness on feet  Abnormal posture  ONSET DATE: 12/04/22  SUBJECTIVE:                                                                                                                                                                                           SUBJECTIVE STATEMENT: Overall doing ok, can wok in the yard, occasionally get the pain on the L side.  PERTINENT HISTORY:  05/16/22-L2-3 fusion, L2 laminectomy  PAIN:  Are you having pain? Yes: NPRS scale: 3/10 Pain location: low  back Pain description: nagging tooth ache, catches him and throws him off balance Aggravating factors: No pattern, except when he sneezes or coughs Relieving factors: Can't identify anything  PRECAUTIONS: None  WEIGHT BEARING RESTRICTIONS: No  FALLS:  Has patient fallen in last 6 months? No  LIVING ENVIRONMENT: Lives with: lives with their family Lives in: House/apartment Stairs: Yes: Internal: 14 steps; on left going up and External: 3 steps; bilateral but cannot reach both Has following equipment at home: None  OCCUPATION: Retired, ride bike 18m/day, yard  work  PLOF: Independent  PATIENT GOALS: Decrease pain to allow him to return to his normal routine.  NEXT MD VISIT: about 12/27/22  OBJECTIVE:   DIAGNOSTIC FINDINGS:  CT 11/27/22 IMPRESSION: 1. Interval discectomy and fusion procedure at the L2-3 level. Hardware components appear well positioned. Considerable lucency surrounding the L2 pedicle screws consistent with ongoing motion. Minimal subsidence of the interbody spacers. Apparent sufficient patency of the canal and foramina. 2. Previous posterior decompression, diskectomy and fusion procedure from L3 through L5 with solid union and sufficient patency of the canal and foramina. 3. L5-S1 chronic disc degeneration with vacuum phenomenon. Endplate osteophytes and bulging of the disc more prominent towards the right. Mild bilateral foraminal stenosis with some potential to affect the exiting L5 nerves. Stenosis of the subarticular lateral recesses, right more than left, that could compress either S1 nerve, particularly the right. 4. Aortic atherosclerosis. Maximal measured diameter 3.0 cm.  PATIENT SURVEYS:  FOTO 58.2  SCREENING FOR RED FLAGS: Bowel or bladder incontinence: No Spinal tumors: No Cauda equina syndrome: No Compression fracture: No Abdominal aneurysm: No  COGNITION: Overall cognitive status: Within functional limits for tasks  assessed     SENSATION: WFL  MUSCLE LENGTH: Hamstrings: Right 68 deg; Left 49 deg Thomas test: WFL B  POSTURE: forward head, decreased lumbar lordosis, and decreased thoracic kyphosis  PALPATION: No real TTP, but lower back muscles very tight B.  LUMBAR ROM:   AROM eval 01/28/23 02/03/23  Flexion Mid shin P WFLS WFL  Extension 50% Limited 50% Limited 25%  Right lateral flexion 3" above knee P Limited 50% Limited 50%  Left lateral flexion 3" above knee P Limited 50% Limited 50%  Right rotation 60%  Limited 25%  Left rotation 90%  WFL   (Blank rows = not tested)  LOWER EXTREMITY ROM: B hip tightness in all planes.  LOWER EXTREMITY MMT:  B hips 4-/5, otherwise WNL  LUMBAR SPECIAL TESTS:  Straight leg raise test: Negative, Slump test: Negative, and FABER test: Positive  FUNCTIONAL TESTS:  5 times sit to stand: 10.2 WNL  GAIT: Distance walked: In clinic distances Assistive device utilized: None Level of assistance: Complete Independence Comments: Patient unsteady upon first standing in lobby, but otherwise no issues.  TODAY'S TREATMENT:                                                                                                                              DATE:  02/03/23 UBE L 3 2 min fwd/2 min backward Gait to back building, One flight of stairs 24 step alternating pattern with and without Rail Lat Pull Down and Seated Row 25 # 2 sets 12 Black Tband trunk ext 20x Seated  trunk rotations w/ yellow ball   01/28/23 Assess lumbar ROM flex WFLS ,ext and lateral flexion decreased 50% MMT grossly tested hips/knees 4/5 LE tightness but improving UBE L 3 3 min each way Lat Pull Down and Seated Row 25 #  2 sets 12 Black Tband trunk ext 20x Cable pulley shld ext 15# 2 sets 10 Cable pulley AR press and circles 10 each 10# 8# lateral flexion 15 x each Supine feet on ball bridge, KTC and obl 15x Iso abdominals with ball 15x hold 3 sec STM with and without theragun to left  LB KT tape left LB  01/12/23 UBE L 3 2 min fwd/2 min backward Cable pulley shld ext 10# 2 sets 10 Cable pulley AR press, circles and rotation 10 each direction each side 10# 8# lateral flexion 15x BIL Lat Pull Down and Seated Row 25 # 2 sets 10 Supine feet on ball bridge, KTC and obl 15x Iso abdominals with ball 15x hold 3 sec PROM LE and trunk - less tightness noted but still tight throughout and stressed need to stretch daily Still very isolated TP left side ( he felt DN was less painful then STW and KT tape helpd0 DN MAlbright PT KTape tape over area to help increase blood flow and increase circulation    PATIENT EDUCATION:  Education details: POC Person educated: Patient Education method: Explanation Education comprehension: verbalized understanding  HOME EXERCISE PROGRAM: [id number]  ASSESSMENT:  CLINICAL IMPRESSION: Pt enters doing well. He has progressed meeting some LTG's. No issue ambulating on uneven surfaces or maintaining alternating pattern with stair training. Postural cue needed with seated rows. Did not do taping this time due to pt stating that it irritated him last time. Pt unable to identify pain location post session.  OBJECTIVE IMPAIRMENTS: decreased activity tolerance, decreased balance, decreased endurance, difficulty walking, decreased ROM, decreased strength, impaired flexibility, improper body mechanics, postural dysfunction, and pain.   ACTIVITY LIMITATIONS: carrying, lifting, bending, sitting, standing, squatting, and locomotion level  PARTICIPATION LIMITATIONS: meal prep, cleaning, driving, shopping, and community activity  PERSONAL FACTORS: Age and Past/current experiences are also affecting patient's functional outcome.   REHAB POTENTIAL: Good  CLINICAL DECISION MAKING: Evolving/moderate complexity  EVALUATION COMPLEXITY: Moderate   GOALS: Goals reviewed with patient? Yes  SHORT TERM GOALS: Target date: 12/23/22  I with initial  HEP Baseline: Goal status: met 12/24/22  LONG TERM GOALS: Target date: 03/05/23  I with final HEP Baseline:  Goal status: INITIAL  2.  Increase FOTO to at least 68 Baseline: 52 Goal status: Met 02/03/23 72  3.  Patient will be able to perform all his normal daily activities with no episode of back pain x 1 week. Baseline: up to 7/10 Goal status: 01/12/23 improving and progressing,  01/28/23 progressing, 02/03/23 Progressing 2-5/10  4.  Patient will walk on level and unlevel surfaces x at least 800' I with no C/O pain in back Baseline:  Goal status: 02/03/23 Met  5.  Up and down a flight of steps, step over step, with no pain or unsteadiness. Baseline:  Goal status: 02/03/23 Met  PLAN:  PT FREQUENCY: 1-2x/week  PT DURATION: 12 weeks  PLANNED INTERVENTIONS: Therapeutic exercises, Therapeutic activity, Neuromuscular re-education, Balance training, Gait training, Patient/Family education, Self Care, Joint mobilization, Stair training, Dry Needling, Electrical stimulation, Spinal manipulation, Cryotherapy, Moist heat, Ionotophoresis 4mg /ml Dexamethasone, and Manual therapy.  PLAN FOR NEXT SESSION: core stab,flexibility. Increase freq to 2 x a week  Patient Details  Name: Kline Bowder DPT 02/03/23 6:22 PM

## 2023-02-05 ENCOUNTER — Ambulatory Visit: Payer: Medicare Other | Admitting: Physical Therapy

## 2023-02-05 DIAGNOSIS — M6281 Muscle weakness (generalized): Secondary | ICD-10-CM

## 2023-02-05 DIAGNOSIS — R293 Abnormal posture: Secondary | ICD-10-CM

## 2023-02-05 DIAGNOSIS — M5459 Other low back pain: Secondary | ICD-10-CM

## 2023-02-05 DIAGNOSIS — R2681 Unsteadiness on feet: Secondary | ICD-10-CM | POA: Diagnosis not present

## 2023-02-05 NOTE — Therapy (Signed)
OUTPATIENT PHYSICAL THERAPY THORACOLUMBAR TREATMENT    Patient Name: [name] MRN: [id number] DOB:1950-09-19, 72 y.o., male Today's Date: 02/05/2023  END OF SESSION:  PT End of Session - 02/05/23 0846     Visit Number 11    Date for PT Re-Evaluation 03/05/23    PT Start Time 0845    PT Stop Time 0930    PT Time Calculation (min) 45 min             Past Medical History:  Diagnosis Date   Hypertension    Past Surgical History:  Procedure Laterality Date   BACK SURGERY  2016   Dr. Nicanor Bake SURGERY  2020   Dr. Franky Macho   TOE SURGERY Left    has had pin placed and removed   TONSILLECTOMY     as a child   Patient Active Problem List   Diagnosis Date Noted   Lumbar adjacent segment disease with spondylolisthesis 05/15/2022   Lumbar stenosis with neurogenic claudication 06/15/2019   Spondylolisthesis of lumbar region 05/27/2013    PCP: Duane Lope  REFERRING PROVIDER: Coletta Memos, MD  REFERRING DIAG:  Diagnosis  [phone] (ICD-10-CM) - Spinal stenosis, lumbar region with neurogenic claudication    Rationale for Evaluation and Treatment: Rehabilitation  THERAPY DIAG:  Muscle weakness (generalized)  Other low back pain  Abnormal posture  ONSET DATE: 12/04/22  SUBJECTIVE:                                                                                                                                                                                           SUBJECTIVE STATEMENT: Afraid to say this but I dont feel any pain today  PERTINENT HISTORY:  05/16/22-L2-3 fusion, L2 laminectomy  PAIN:  Are you having pain? Yes: NPRS scale: 0/10 Pain location: low back Pain description: nagging tooth ache, catches him and throws him off balance Aggravating factors: No pattern, except when he sneezes or coughs Relieving factors: Can't identify anything  PRECAUTIONS: None  WEIGHT BEARING RESTRICTIONS: No  FALLS:  Has patient fallen in last 6 months?  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  NEXT MD VISIT: about 12/27/22  OBJECTIVE:   DIAGNOSTIC FINDINGS:  CT 11/27/22 IMPRESSION: 1. Interval discectomy and fusion procedure at the L2-3 level. Hardware components appear well  positioned. Considerable lucency surrounding the L2 pedicle screws consistent with ongoing motion. Minimal subsidence of the interbody spacers. Apparent sufficient patency of the canal and foramina. 2. Previous posterior decompression, diskectomy and fusion procedure from L3 through L5 with solid union and sufficient patency of the canal and foramina. 3. L5-S1 chronic disc degeneration with vacuum phenomenon. Endplate osteophytes and bulging of the disc more prominent towards the right. Mild bilateral foraminal stenosis with some potential to affect the exiting L5 nerves. Stenosis of the subarticular lateral recesses, right more than left, that could compress either S1 nerve, particularly the right. 4. Aortic atherosclerosis. Maximal measured diameter 3.0 cm.  PATIENT SURVEYS:  FOTO 58.2  SCREENING FOR RED FLAGS: Bowel or bladder incontinence: No Spinal tumors: No Cauda equina syndrome: No Compression fracture: No Abdominal aneurysm: No  COGNITION: Overall cognitive status: Within functional limits for tasks assessed     SENSATION: WFL  MUSCLE LENGTH: Hamstrings: Right 68 deg; Left 49 deg Thomas test: WFL B  POSTURE: forward head, decreased lumbar lordosis, and decreased thoracic kyphosis  PALPATION: No real TTP, but lower back muscles very tight B.  LUMBAR ROM:   AROM eval 01/28/23 02/03/23  Flexion Mid shin  P WFLS WFL  Extension 50% Limited 50% Limited 25%  Right lateral flexion 3" above knee P Limited 50% Limited 50%  Left lateral flexion 3" above knee P Limited 50% Limited 50%  Right rotation 60%  Limited 25%  Left rotation 90%  WFL   (Blank rows = not tested)  LOWER EXTREMITY ROM: B hip tightness in all planes.  LOWER EXTREMITY MMT:  B hips 4-/5, otherwise WNL  LUMBAR SPECIAL TESTS:  Straight leg raise test: Negative, Slump test: Negative, and FABER test: Positive  FUNCTIONAL TESTS:  5 times sit to stand: 10.2 WNL  GAIT: Distance walked: In clinic distances Assistive device utilized: None Level of assistance: Complete Independence Comments: Patient unsteady upon first standing in lobby, but otherwise no issues.  TODAY'S TREATMENT:                                                                                                                              DATE:  02/05/23 UBE L 3 3 min fwd/3 min backward Lat Pull Down and Seated Row 25 # 2 sets 15 Black Tband trunk ext 20x Cable pulley AR press, circles and rotations 10 x each BIL 10# Lateral flexion 8# 15x each side STS with wt ball press 2 sets 10 Supine feet on ball bridge, KTC and obl. Isometric abd PROM LE and trunk      02/03/23 UBE L 3 2 min fwd/2 min backward Gait to back building, One flight of stairs 24 step alternating pattern with and without Rail Lat Pull Down and Seated Row 25 # 2 sets 12 Black Tband trunk ext 20x Seated  trunk rotations w/ yellow ball   01/28/23 Assess lumbar ROM flex WFLS ,ext and lateral flexion decreased 50% MMT grossly  tested hips/knees 4/5 LE tightness but improving UBE L 3 3 min each way Lat Pull Down and Seated Row 25 # 2 sets 12 Black Tband trunk ext 20x Cable pulley shld ext 15# 2 sets 10 Cable pulley AR press and circles 10 each 10# 8# lateral flexion 15 x each Supine feet on ball bridge, KTC and obl 15x Iso abdominals with ball 15x hold 3 sec STM with and without theragun  to left LB KT tape left LB  01/12/23 UBE L 3 2 min fwd/2 min backward Cable pulley shld ext 10# 2 sets 10 Cable pulley AR press, circles and rotation 10 each direction each side 10# 8# lateral flexion 15x BIL Lat Pull Down and Seated Row 25 # 2 sets 10 Supine feet on ball bridge, KTC and obl 15x Iso abdominals with ball 15x hold 3 sec PROM LE and trunk - less tightness noted but still tight throughout and stressed need to stretch daily Still very isolated TP left side ( he felt DN was less painful then STW and KT tape helpd0 DN MAlbright PT KTape tape over area to help increase blood flow and increase circulation    PATIENT EDUCATION:  Education details: POC Person educated: Patient Education method: Explanation Education comprehension: verbalized understanding  HOME EXERCISE PROGRAM: [id number]  ASSESSMENT:  CLINICAL IMPRESSION: Pt enters doing well and no pain so we continued to focus on core strength to hopefully avoid flare ups with cuing as needed. With focus on core he was aware of left side and some increase in pain. OBJECTIVE IMPAIRMENTS: decreased activity tolerance, decreased balance, decreased endurance, difficulty walking, decreased ROM, decreased strength, impaired flexibility, improper body mechanics, postural dysfunction, and pain.   ACTIVITY LIMITATIONS: carrying, lifting, bending, sitting, standing, squatting, and locomotion level  PARTICIPATION LIMITATIONS: meal prep, cleaning, driving, shopping, and community activity  PERSONAL FACTORS: Age and Past/current experiences are also affecting patient's functional outcome.   REHAB POTENTIAL: Good  CLINICAL DECISION MAKING: Evolving/moderate complexity  EVALUATION COMPLEXITY: Moderate   GOALS: Goals reviewed with patient? Yes  SHORT TERM GOALS: Target date: 12/23/22  I with initial HEP Baseline: Goal status: met 12/24/22  LONG TERM GOALS: Target date: 03/05/23  I with final HEP Baseline:  Goal status:  INITIAL  2.  Increase FOTO to at least 68 Baseline: 52 Goal status: Met 02/03/23 72  3.  Patient will be able to perform all his normal daily activities with no episode of back pain x 1 week. Baseline: up to 7/10 Goal status: 01/12/23 improving and progressing,  01/28/23 progressing, 02/03/23 Progressing 2-5/10  4.  Patient will walk on level and unlevel surfaces x at least 800' I with no C/O pain in back Baseline:  Goal status: 02/03/23 Met  5.  Up and down a flight of steps, step over step, with no pain or unsteadiness. Baseline:  Goal status: 02/03/23 Met  PLAN:  PT FREQUENCY: 1-2x/week  PT DURATION: 12 weeks  PLANNED INTERVENTIONS: Therapeutic exercises, Therapeutic activity, Neuromuscular re-education, Balance training, Gait training, Patient/Family education, Self Care, Joint mobilization, Stair training, Dry Needling, Electrical stimulation, Spinal manipulation, Cryotherapy, Moist heat, Ionotophoresis 4mg /ml Dexamethasone, and Manual therapy.  PLAN FOR NEXT SESSION: core stab,flexibility.  Patient Details  Name: [name] MRN: [id number] Date of Birth: 05/09/1951 Referring Provider:  Coletta Memos, MD  Encounter Date: 02/05/2023   Suanne Marker, PTA 02/05/2023, 8:47 AM  [facility] at [facility] [address] [place], [place], [address] Phone: [phone]  Fax:  [phone]

## 2023-02-09 ENCOUNTER — Ambulatory Visit: Payer: Medicare Other | Admitting: Physical Therapy

## 2023-02-09 DIAGNOSIS — M5459 Other low back pain: Secondary | ICD-10-CM

## 2023-02-09 DIAGNOSIS — R293 Abnormal posture: Secondary | ICD-10-CM | POA: Diagnosis not present

## 2023-02-09 DIAGNOSIS — M6281 Muscle weakness (generalized): Secondary | ICD-10-CM | POA: Diagnosis not present

## 2023-02-09 DIAGNOSIS — R2681 Unsteadiness on feet: Secondary | ICD-10-CM | POA: Diagnosis not present

## 2023-02-09 NOTE — Therapy (Signed)
OUTPATIENT PHYSICAL THERAPY THORACOLUMBAR TREATMENT    Patient Name: [name] MRN: [id number] DOB:Apr 24, 1951, 72 y.o., male Today's Date: 02/09/2023  END OF SESSION:  PT End of Session - 02/09/23 0843     Visit Number 12    Date for PT Re-Evaluation 03/05/23    PT Start Time 0845    PT Stop Time 0930    PT Time Calculation (min) 45 min             Past Medical History:  Diagnosis Date   Hypertension    Past Surgical History:  Procedure Laterality Date   BACK SURGERY  2016   Dr. Nicanor Bake SURGERY  2020   Dr. Franky Macho   TOE SURGERY Left    has had pin placed and removed   TONSILLECTOMY     as a child   Patient Active Problem List   Diagnosis Date Noted   Lumbar adjacent segment disease with spondylolisthesis 05/15/2022   Lumbar stenosis with neurogenic claudication 06/15/2019   Spondylolisthesis of lumbar region 05/27/2013    PCP: Duane Lope  REFERRING PROVIDER: Coletta Memos, MD  REFERRING DIAG:  Diagnosis  [phone] (ICD-10-CM) - Spinal stenosis, lumbar region with neurogenic claudication    Rationale for Evaluation and Treatment: Rehabilitation  THERAPY DIAG:  Muscle weakness (generalized)  Other low back pain  Abnormal posture  ONSET DATE: 12/04/22  SUBJECTIVE:                                                                                                                                                                                           SUBJECTIVE STATEMENT: Boy I really hurt after last session, eased off through out next day  PERTINENT HISTORY:  05/16/22-L2-3 fusion, L2 laminectomy  PAIN:  Are you having pain? Yes: NPRS scale: 3/10 Pain location: low back Pain description: nagging tooth ache, catches him and throws him off balance Aggravating factors: No pattern, except when he sneezes or coughs Relieving factors: Can't identify anything  PRECAUTIONS: None  WEIGHT BEARING RESTRICTIONS: No  FALLS:  Has patient fallen  in last 6 months? 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  NEXT MD VISIT: about 12/27/22  OBJECTIVE:   DIAGNOSTIC FINDINGS:  CT 11/27/22 IMPRESSION: 1. Interval discectomy and fusion procedure at the L2-3 level. Hardware components  appear well positioned. Considerable lucency surrounding the L2 pedicle screws consistent with ongoing motion. Minimal subsidence of the interbody spacers. Apparent sufficient patency of the canal and foramina. 2. Previous posterior decompression, diskectomy and fusion procedure from L3 through L5 with solid union and sufficient patency of the canal and foramina. 3. L5-S1 chronic disc degeneration with vacuum phenomenon. Endplate osteophytes and bulging of the disc more prominent towards the right. Mild bilateral foraminal stenosis with some potential to affect the exiting L5 nerves. Stenosis of the subarticular lateral recesses, right more than left, that could compress either S1 nerve, particularly the right. 4. Aortic atherosclerosis. Maximal measured diameter 3.0 cm.  PATIENT SURVEYS:  FOTO 58.2  SCREENING FOR RED FLAGS: Bowel or bladder incontinence: No Spinal tumors: No Cauda equina syndrome: No Compression fracture: No Abdominal aneurysm: No  COGNITION: Overall cognitive status: Within functional limits for tasks assessed     SENSATION: WFL  MUSCLE LENGTH: Hamstrings: Right 68 deg; Left 49 deg Thomas test: WFL B  POSTURE: forward head, decreased lumbar lordosis, and decreased thoracic kyphosis  PALPATION: No real TTP, but lower back muscles very tight B.  LUMBAR ROM:   AROM eval 01/28/23 02/03/23   Flexion Mid shin P WFLS WFL  Extension 50% Limited 50% Limited 25%  Right lateral flexion 3" above knee P Limited 50% Limited 50%  Left lateral flexion 3" above knee P Limited 50% Limited 50%  Right rotation 60%  Limited 25%  Left rotation 90%  WFL   (Blank rows = not tested)  LOWER EXTREMITY ROM: B hip tightness in all planes.  LOWER EXTREMITY MMT:  B hips 4-/5, otherwise WNL  LUMBAR SPECIAL TESTS:  Straight leg raise test: Negative, Slump test: Negative, and FABER test: Positive  FUNCTIONAL TESTS:  5 times sit to stand: 10.2 WNL  GAIT: Distance walked: In clinic distances Assistive device utilized: None Level of assistance: Complete Independence Comments: Patient unsteady upon first standing in lobby, but otherwise no issues.  TODAY'S TREATMENT:                                                                                                                              DATE:   02/09/23 Black tband trunk flex and ext 20 x Lat Pull Down and Seated Row 25 # 2 sets 15 STS with wt ball press 2 sets 10 Wt ball trunk ext 10 x Upright row into trunk ext 5# each hand 10 x Seated bent over row, horz abd and shld ext 4# 2 sets 10 Quadreped UE 10 x, LE 10 x opp UE/LE 10 x Feet on ball bridge 15 x hold 3 sec, obl 20 , iso abdominals, KTC  02/05/23 UBE L 3 3 min fwd/3 min backward Lat Pull Down and Seated Row 25 # 2 sets 15 Black Tband trunk ext 20x Cable pulley AR press, circles and rotations 10 x each BIL 10# Lateral flexion 8# 15x each side STS with wt ball  press 2 sets 10 Supine feet on ball bridge, KTC and obl. Isometric abd PROM LE and trunk      02/03/23 UBE L 3 2 min fwd/2 min backward Gait to back building, One flight of stairs 24 step alternating pattern with and without Rail Lat Pull Down and Seated Row 25 # 2 sets 12 Black Tband trunk ext 20x Seated  trunk rotations w/ yellow ball   01/28/23 Assess lumbar ROM flex WFLS ,ext and lateral flexion decreased 50% MMT  grossly tested hips/knees 4/5 LE tightness but improving UBE L 3 3 min each way Lat Pull Down and Seated Row 25 # 2 sets 12 Black Tband trunk ext 20x Cable pulley shld ext 15# 2 sets 10 Cable pulley AR press and circles 10 each 10# 8# lateral flexion 15 x each Supine feet on ball bridge, KTC and obl 15x Iso abdominals with ball 15x hold 3 sec STM with and without theragun to left LB KT tape left LB  01/12/23 UBE L 3 2 min fwd/2 min backward Cable pulley shld ext 10# 2 sets 10 Cable pulley AR press, circles and rotation 10 each direction each side 10# 8# lateral flexion 15x BIL Lat Pull Down and Seated Row 25 # 2 sets 10 Supine feet on ball bridge, KTC and obl 15x Iso abdominals with ball 15x hold 3 sec PROM LE and trunk - less tightness noted but still tight throughout and stressed need to stretch daily Still very isolated TP left side ( he felt DN was less painful then STW and KT tape helpd0 DN MAlbright PT KTape tape over area to help increase blood flow and increase circulation    PATIENT EDUCATION:  Education details: POC Person educated: Patient Education method: Explanation Education comprehension: verbalized understanding  HOME EXERCISE PROGRAM: [id number]  ASSESSMENT:  CLINICAL IMPRESSION: pt enters clinic stating increased pain after last session , unsure what aggravates pain. After reviewing ex form last session omitted rottaional ex to see if that reduces pain and progressed ex.  OBJECTIVE IMPAIRMENTS: decreased activity tolerance, decreased balance, decreased endurance, difficulty walking, decreased ROM, decreased strength, impaired flexibility, improper body mechanics, postural dysfunction, and pain.   ACTIVITY LIMITATIONS: carrying, lifting, bending, sitting, standing, squatting, and locomotion level  PARTICIPATION LIMITATIONS: meal prep, cleaning, driving, shopping, and community activity  PERSONAL FACTORS: Age and Past/current experiences are also affecting  patient's functional outcome.   REHAB POTENTIAL: Good  CLINICAL DECISION MAKING: Evolving/moderate complexity  EVALUATION COMPLEXITY: Moderate   GOALS: Goals reviewed with patient? Yes  SHORT TERM GOALS: Target date: 12/23/22  I with initial HEP Baseline: Goal status: met 12/24/22  LONG TERM GOALS: Target date: 03/05/23  I with final HEP Baseline:  Goal status: INITIAL  2.  Increase FOTO to at least 68 Baseline: 52 Goal status: Met 02/03/23 72  3.  Patient will be able to perform all his normal daily activities with no episode of back pain x 1 week. Baseline: up to 7/10 Goal status: 01/12/23 improving and progressing,  01/28/23 progressing, 02/03/23 Progressing 2-5/10  4.  Patient will walk on level and unlevel surfaces x at least 800' I with no C/O pain in back Baseline:  Goal status: 02/03/23 Met  5.  Up and down a flight of steps, step over step, with no pain or unsteadiness. Baseline:  Goal status: 02/03/23 Met  PLAN:  PT FREQUENCY: 1-2x/week  PT DURATION: 12 weeks  PLANNED INTERVENTIONS: Therapeutic exercises, Therapeutic activity, Neuromuscular re-education, Balance training, Gait training, Patient/Family education,  Self Care, Joint mobilization, Stair training, Dry Needling, Electrical stimulation, Spinal manipulation, Cryotherapy, Moist heat, Ionotophoresis 4mg /ml Dexamethasone, and Manual therapy.  PLAN FOR NEXT SESSION: core stab,flexibility.  Patient Details  Name: [name] MRN: [id number] Date of Birth: November 13, 1950 Referring Provider:  Coletta Memos, MD  Encounter Date: 02/09/2023   Suanne Marker, PTA 02/09/2023, 8:43 AM  [facility] at [facility] [address] [place], [place], [address] Phone: [phone]   Fax:  [phone] [address] [place], [place], [address] Phone: [phone]   Fax:  [phone]  Patient Details  Name:  [name] MRN: [id number] Date of Birth: [birth year] Referring Provider:  Coletta Memos, MD  Encounter Date: 02/09/2023   Suanne Marker, PTA 02/09/2023, 8:43 AM  [facility] [address] [place], [place], [address] Phone: [phone]   Fax:  [phone]

## 2023-02-10 DIAGNOSIS — J449 Chronic obstructive pulmonary disease, unspecified: Secondary | ICD-10-CM | POA: Diagnosis not present

## 2023-02-10 DIAGNOSIS — Z0000 Encounter for general adult medical examination without abnormal findings: Secondary | ICD-10-CM | POA: Diagnosis not present

## 2023-02-10 DIAGNOSIS — I10 Essential (primary) hypertension: Secondary | ICD-10-CM | POA: Diagnosis not present

## 2023-02-10 DIAGNOSIS — R454 Irritability and anger: Secondary | ICD-10-CM | POA: Diagnosis not present

## 2023-02-10 DIAGNOSIS — I77811 Abdominal aortic ectasia: Secondary | ICD-10-CM | POA: Diagnosis not present

## 2023-02-10 DIAGNOSIS — E7800 Pure hypercholesterolemia, unspecified: Secondary | ICD-10-CM | POA: Diagnosis not present

## 2023-02-11 ENCOUNTER — Ambulatory Visit: Payer: Medicare Other | Admitting: Physical Therapy

## 2023-02-11 DIAGNOSIS — M6281 Muscle weakness (generalized): Secondary | ICD-10-CM

## 2023-02-11 DIAGNOSIS — M5459 Other low back pain: Secondary | ICD-10-CM

## 2023-02-11 DIAGNOSIS — R2681 Unsteadiness on feet: Secondary | ICD-10-CM | POA: Diagnosis not present

## 2023-02-11 DIAGNOSIS — R293 Abnormal posture: Secondary | ICD-10-CM | POA: Diagnosis not present

## 2023-02-11 NOTE — Therapy (Signed)
OUTPATIENT PHYSICAL THERAPY THORACOLUMBAR TREATMENT    Patient Name: [name] MRN: [id number] DOB:[birth year], 72 y.o., male Today's Date: 02/11/2023  END OF SESSION:  PT End of Session - 02/11/23 0929     Visit Number 13    Date for PT Re-Evaluation 03/05/23    PT Start Time 0930    PT Stop Time 1015    PT Time Calculation (min) 45 min             Past Medical History:  Diagnosis Date   Hypertension    Past Surgical History:  Procedure Laterality Date   BACK SURGERY  2016   Dr. Nicanor Bake SURGERY  2020   Dr. Franky Macho   TOE SURGERY Left    has had pin placed and removed   TONSILLECTOMY     as a child   Patient Active Problem List   Diagnosis Date Noted   Lumbar adjacent segment disease with spondylolisthesis 05/15/2022   Lumbar stenosis with neurogenic claudication 06/15/2019   Spondylolisthesis of lumbar region 05/27/2013    PCP: Duane Lope  REFERRING PROVIDER: Coletta Memos, MD  REFERRING DIAG:  Diagnosis  [phone] (ICD-10-CM) - Spinal stenosis, lumbar region with neurogenic claudication    Rationale for Evaluation and Treatment: Rehabilitation  THERAPY DIAG:  Muscle weakness (generalized)  Other low back pain  Abnormal posture  ONSET DATE: 12/04/22  SUBJECTIVE:                                                                                                                                                                                           SUBJECTIVE STATEMENT: Pain in later part of day and pain woke me up. Aware on left side  PERTINENT HISTORY:  05/16/22-L2-3 fusion, L2 laminectomy  PAIN:  Are you having pain? Yes: NPRS scale: 3/10 Pain location: low back Pain description: nagging tooth ache, catches him and throws him off balance Aggravating factors: No pattern, except when he sneezes or coughs Relieving factors: Can't identify anything  PRECAUTIONS: None  WEIGHT BEARING RESTRICTIONS: No  FALLS:  Has patient fallen in  last 6 months? No  LIVING ENVIRONMENT: Lives with: lives with their family Lives in: House/apartment Stairs: Yes: Internal: 14 steps; on left going up and External: 3 steps; bilateral but cannot reach both Has following equipment at home: None  OCCUPATION: Retired, ride bike 15m/day, yard work  PLOF: Independent  PATIENT GOALS: Decrease pain to allow him to return to his normal routine.  NEXT MD VISIT: about 12/27/22  OBJECTIVE:   DIAGNOSTIC FINDINGS:  CT 11/27/22 IMPRESSION: 1. Interval discectomy and fusion procedure at the L2-3 level.  Hardware components appear well positioned. Considerable lucency surrounding the L2 pedicle screws consistent with ongoing motion. Minimal subsidence of the interbody spacers. Apparent sufficient patency of the canal and foramina. 2. Previous posterior decompression, diskectomy and fusion procedure from L3 through L5 with solid union and sufficient patency of the canal and foramina. 3. L5-S1 chronic disc degeneration with vacuum phenomenon. Endplate osteophytes and bulging of the disc more prominent towards the right. Mild bilateral foraminal stenosis with some potential to affect the exiting L5 nerves. Stenosis of the subarticular lateral recesses, right more than left, that could compress either S1 nerve, particularly the right. 4. Aortic atherosclerosis. Maximal measured diameter 3.0 cm.  PATIENT SURVEYS:  FOTO 58.2  SCREENING FOR RED FLAGS: Bowel or bladder incontinence: No Spinal tumors: No Cauda equina syndrome: No Compression fracture: No Abdominal aneurysm: No  COGNITION: Overall cognitive status: Within functional limits for tasks assessed     SENSATION: WFL  MUSCLE LENGTH: Hamstrings: Right 68 deg; Left 49 deg Thomas test: WFL B  POSTURE: forward head, decreased lumbar lordosis, and decreased thoracic kyphosis  PALPATION: No real TTP, but lower back muscles very tight B.  LUMBAR ROM:   AROM eval 01/28/23 02/03/23   Flexion Mid shin P WFLS WFL  Extension 50% Limited 50% Limited 25%  Right lateral flexion 3" above knee P Limited 50% Limited 50%  Left lateral flexion 3" above knee P Limited 50% Limited 50%  Right rotation 60%  Limited 25%  Left rotation 90%  WFL   (Blank rows = not tested)  LOWER EXTREMITY ROM: B hip tightness in all planes.  LOWER EXTREMITY MMT:  B hips 4-/5, otherwise WNL  LUMBAR SPECIAL TESTS:  Straight leg raise test: Negative, Slump test: Negative, and FABER test: Positive  FUNCTIONAL TESTS:  5 times sit to stand: 10.2 WNL  GAIT: Distance walked: In clinic distances Assistive device utilized: None Level of assistance: Complete Independence Comments: Patient unsteady upon first standing in lobby, but otherwise no issues.  TODAY'S TREATMENT:                                                                                                                              DATE:   02/11/23 Green tband 15 x shld ext,row,Horz abd and ER UBE standing 2 min each way L 3 20# upright row into trunk ext 2 sets 10 STS wt ball 10 x chest press,10x OH press Seated bent over row, horz abd and shld ext 4# 2 sets 10 Iso abdominals upper and lower 10 each Feet on ball bridge 10 x hold 3 sec, SL bridge 5 x each Dead bug Resisted clams and marching PROM LE       02/09/23 Black tband trunk flex and ext 20 x Lat Pull Down and Seated Row 25 # 2 sets 15 STS with wt ball press 2 sets 10 Wt ball trunk ext 10 x Upright row into trunk ext 5# each hand 10 x Seated  bent over row, horz abd and shld ext 4# 2 sets 10 Quadreped UE 10 x, LE 10 x opp UE/LE 10 x Feet on ball bridge 15 x hold 3 sec, obl 20 , iso abdominals, KTC  02/05/23 UBE L 3 3 min fwd/3 min backward Lat Pull Down and Seated Row 25 # 2 sets 15 Black Tband trunk ext 20x Cable pulley AR press, circles and rotations 10 x each BIL 10# Lateral flexion 8# 15x each side STS with wt ball press 2 sets 10 Supine feet on ball bridge,  KTC and obl. Isometric abd PROM LE and trunk      02/03/23 UBE L 3 2 min fwd/2 min backward Gait to back building, One flight of stairs 24 step alternating pattern with and without Rail Lat Pull Down and Seated Row 25 # 2 sets 12 Black Tband trunk ext 20x Seated  trunk rotations w/ yellow ball   01/28/23 Assess lumbar ROM flex WFLS ,ext and lateral flexion decreased 50% MMT grossly tested hips/knees 4/5 LE tightness but improving UBE L 3 3 min each way Lat Pull Down and Seated Row 25 # 2 sets 12 Black Tband trunk ext 20x Cable pulley shld ext 15# 2 sets 10 Cable pulley AR press and circles 10 each 10# 8# lateral flexion 15 x each Supine feet on ball bridge, KTC and obl 15x Iso abdominals with ball 15x hold 3 sec STM with and without theragun to left LB KT tape left LB  01/12/23 UBE L 3 2 min fwd/2 min backward Cable pulley shld ext 10# 2 sets 10 Cable pulley AR press, circles and rotation 10 each direction each side 10# 8# lateral flexion 15x BIL Lat Pull Down and Seated Row 25 # 2 sets 10 Supine feet on ball bridge, KTC and obl 15x Iso abdominals with ball 15x hold 3 sec PROM LE and trunk - less tightness noted but still tight throughout and stressed need to stretch daily Still very isolated TP left side ( he felt DN was less painful then STW and KT tape helpd0 DN MAlbright PT KTape tape over area to help increase blood flow and increase circulation    PATIENT EDUCATION:  Education details: POC Person educated: Patient Education method: Explanation Education comprehension: verbalized understanding  HOME EXERCISE PROGRAM: [id number]  ASSESSMENT:  CLINICAL IMPRESSION: pt continues to be up and down with pain. Goals assessed and documented. Pt has pain both with flex and ext. Standing UBE pain fwd and dead bug caused pain  OBJECTIVE IMPAIRMENTS: decreased activity tolerance, decreased balance, decreased endurance, difficulty walking, decreased ROM, decreased strength,  impaired flexibility, improper body mechanics, postural dysfunction, and pain.   ACTIVITY LIMITATIONS: carrying, lifting, bending, sitting, standing, squatting, and locomotion level  PARTICIPATION LIMITATIONS: meal prep, cleaning, driving, shopping, and community activity  PERSONAL FACTORS: Age and Past/current experiences are also affecting patient's functional outcome.   REHAB POTENTIAL: Good  CLINICAL DECISION MAKING: Evolving/moderate complexity  EVALUATION COMPLEXITY: Moderate   GOALS: Goals reviewed with patient? Yes  SHORT TERM GOALS: Target date: 12/23/22  I with initial HEP Baseline: Goal status: met 12/24/22  LONG TERM GOALS: Target date: 03/05/23  I with final HEP Baseline:  Goal status: evolving 02/11/23  2.  Increase FOTO to at least 68 Baseline: 52 Goal status: Met 02/03/23 72  3.  Patient will be able to perform all his normal daily activities with no episode of back pain x 1 week. Baseline: up to 7/10 Goal status: 01/12/23 improving and  progressing,  01/28/23 progressing, 02/03/23 Progressing 2-5/10  02/11/23 progressing  4.  Patient will walk on level and unlevel surfaces x at least 800' I with no C/O pain in back Baseline:  Goal status: 02/03/23 Met  5.  Up and down a flight of steps, step over step, with no pain or unsteadiness. Baseline:  Goal status: 02/03/23 Met  PLAN:  PT FREQUENCY: 1-2x/week  PT DURATION: 12 weeks  PLANNED INTERVENTIONS: Therapeutic exercises, Therapeutic activity, Neuromuscular re-education, Balance training, Gait training, Patient/Family education, Self Care, Joint mobilization, Stair training, Dry Needling, Electrical stimulation, Spinal manipulation, Cryotherapy, Moist heat, Ionotophoresis 4mg /ml Dexamethasone, and Manual therapy.  PLAN FOR NEXT SESSION: core stab,flexibility.  Patient Details  Name: [name] MRN: [id number] Date of Birth: 02/22/1951 Referring Provider:  Coletta Memos, MD  Encounter Date:  02/11/2023   Suanne Marker, PTA 02/11/2023, 9:30 AM  [facility] [address] [place], [place], [address] Phone: [phone]   Fax:  [phone] [address] [place], [place], [address] Phone: [phone]   Fax:  [phone]

## 2023-02-16 ENCOUNTER — Encounter: Payer: Self-pay | Admitting: Physical Therapy

## 2023-02-16 ENCOUNTER — Ambulatory Visit: Payer: Medicare Other | Admitting: Physical Therapy

## 2023-02-16 DIAGNOSIS — M6281 Muscle weakness (generalized): Secondary | ICD-10-CM | POA: Diagnosis not present

## 2023-02-16 DIAGNOSIS — R293 Abnormal posture: Secondary | ICD-10-CM | POA: Diagnosis not present

## 2023-02-16 DIAGNOSIS — M5459 Other low back pain: Secondary | ICD-10-CM

## 2023-02-16 DIAGNOSIS — R2681 Unsteadiness on feet: Secondary | ICD-10-CM | POA: Diagnosis not present

## 2023-02-16 NOTE — Therapy (Signed)
OUTPATIENT PHYSICAL THERAPY THORACOLUMBAR TREATMENT    Patient Name: [name] MRN: [id number] DOB:[birth year], 72 y.o., male Today's Date: 02/16/2023  END OF SESSION:    Past Medical History:  Diagnosis Date   Hypertension    Past Surgical History:  Procedure Laterality Date   BACK SURGERY  2016   Dr. Nicanor Bake SURGERY  2020   Dr. Franky Macho   TOE SURGERY Left    has had pin placed and removed   TONSILLECTOMY     as a child   Patient Active Problem List   Diagnosis Date Noted   Lumbar adjacent segment disease with spondylolisthesis 05/15/2022   Lumbar stenosis with neurogenic claudication 06/15/2019   Spondylolisthesis of lumbar region 05/27/2013    PCP: Duane Lope  REFERRING PROVIDER: Coletta Memos, MD  REFERRING DIAG:  Diagnosis  [phone] (ICD-10-CM) - Spinal stenosis, lumbar region with neurogenic claudication    Rationale for Evaluation and Treatment: Rehabilitation  THERAPY DIAG:  Muscle weakness (generalized)  Other low back pain  Abnormal posture  ONSET DATE: 12/04/22  SUBJECTIVE:                                                                                                                                                                                           SUBJECTIVE STATEMENT: Very small pain in the same area 2/10. Thursday session went well  PERTINENT HISTORY:  05/16/22-L2-3 fusion, L2 laminectomy  PAIN:  Are you having pain? Yes: NPRS scale: 3/10 Pain location: low back Pain description: nagging tooth ache, catches him and throws him off balance Aggravating factors: No pattern, except when he sneezes or coughs Relieving factors: Can't identify anything  PRECAUTIONS: None  WEIGHT BEARING RESTRICTIONS: No  FALLS:  Has patient fallen in last 6 months? No  LIVING ENVIRONMENT: Lives with: lives with their family Lives in: House/apartment Stairs: Yes: Internal: 14 steps; on left going up and External: 3 steps; bilateral but  cannot reach both Has following equipment at home: None  OCCUPATION: Retired, ride bike 61m/day, yard work  PLOF: Independent  PATIENT GOALS: Decrease pain to allow him to return to his normal routine.  NEXT MD VISIT: about 12/27/22  OBJECTIVE:   DIAGNOSTIC FINDINGS:  CT 11/27/22 IMPRESSION: 1. Interval discectomy and fusion procedure at the L2-3 level. Hardware components appear well positioned. Considerable lucency surrounding the L2 pedicle screws consistent with ongoing motion. Minimal subsidence of the interbody spacers. Apparent sufficient patency of the canal and foramina. 2. Previous posterior decompression, diskectomy and fusion procedure from L3 through L5 with solid union and sufficient patency of the canal and foramina. 3. L5-S1 chronic disc degeneration  with vacuum phenomenon. Endplate osteophytes and bulging of the disc more prominent towards the right. Mild bilateral foraminal stenosis with some potential to affect the exiting L5 nerves. Stenosis of the subarticular lateral recesses, right more than left, that could compress either S1 nerve, particularly the right. 4. Aortic atherosclerosis. Maximal measured diameter 3.0 cm.  PATIENT SURVEYS:  FOTO 58.2  SCREENING FOR RED FLAGS: Bowel or bladder incontinence: No Spinal tumors: No Cauda equina syndrome: No Compression fracture: No Abdominal aneurysm: No  COGNITION: Overall cognitive status: Within functional limits for tasks assessed     SENSATION: WFL  MUSCLE LENGTH: Hamstrings: Right 68 deg; Left 49 deg Thomas test: WFL B  POSTURE: forward head, decreased lumbar lordosis, and decreased thoracic kyphosis  PALPATION: No real TTP, but lower back muscles very tight B.  LUMBAR ROM:   AROM eval 01/28/23 02/03/23  Flexion Mid shin P WFLS WFL  Extension 50% Limited 50% Limited 25%  Right lateral flexion 3" above knee P Limited 50% Limited 50%  Left lateral flexion 3" above knee P Limited 50% Limited 50%   Right rotation 60%  Limited 25%  Left rotation 90%  WFL   (Blank rows = not tested)  LOWER EXTREMITY ROM: B hip tightness in all planes.  LOWER EXTREMITY MMT:  B hips 4-/5, otherwise WNL  LUMBAR SPECIAL TESTS:  Straight leg raise test: Negative, Slump test: Negative, and FABER test: Positive  FUNCTIONAL TESTS:  5 times sit to stand: 10.2 WNL  GAIT: Distance walked: In clinic distances Assistive device utilized: None Level of assistance: Complete Independence Comments: Patient unsteady upon first standing in lobby, but otherwise no issues.  TODAY'S TREATMENT:                                                                                                                              DATE:  02/16/23 Trunk flex and ext black t-band sitting 2x10 Standing rows/ext/hor abd blue t-band 2x15 Standing marches opp hand to opp knee 2x10 Trunk lateral flexion standing with B 8lb weights 2x10 Trunk rotation standing holding blue weighted ball 2x10  OHP standing with blue weighted ball 2x10 Crunches supine on mat table x12 Crunches opp hand to opp knee x12 Bridges/obliques with red ball under legs 2x10 Single leg bridge x5 each Dead bug opp hand opp leg x10 PROM LE  02/11/23 Green tband 15 x shld ext,row,Horz abd and ER UBE standing 2 min each way L 3 20# upright row into trunk ext 2 sets 10 STS wt ball 10 x chest press,10x OH press Seated bent over row, horz abd and shld ext 4# 2 sets 10 Iso abdominals upper and lower 10 each Feet on ball bridge 10 x hold 3 sec, SL bridge 5 x each Dead bug Resisted clams and marching PROM LE       02/09/23 Black tband trunk flex and ext 20 x Lat Pull Down and Seated Row 25 # 2 sets 15 STS  with wt ball press 2 sets 10 Wt ball trunk ext 10 x Upright row into trunk ext 5# each hand 10 x Seated bent over row, horz abd and shld ext 4# 2 sets 10 Quadreped UE 10 x, LE 10 x opp UE/LE 10 x Feet on ball bridge 15 x hold 3 sec, obl 20 , iso  abdominals, KTC  02/05/23 UBE L 3 3 min fwd/3 min backward Lat Pull Down and Seated Row 25 # 2 sets 15 Black Tband trunk ext 20x Cable pulley AR press, circles and rotations 10 x each BIL 10# Lateral flexion 8# 15x each side STS with wt ball press 2 sets 10 Supine feet on ball bridge, KTC and obl. Isometric abd PROM LE and trunk      02/03/23 UBE L 3 2 min fwd/2 min backward Gait to back building, One flight of stairs 24 step alternating pattern with and without Rail Lat Pull Down and Seated Row 25 # 2 sets 12 Black Tband trunk ext 20x Seated  trunk rotations w/ yellow ball   01/28/23 Assess lumbar ROM flex WFLS ,ext and lateral flexion decreased 50% MMT grossly tested hips/knees 4/5 LE tightness but improving UBE L 3 3 min each way Lat Pull Down and Seated Row 25 # 2 sets 12 Black Tband trunk ext 20x Cable pulley shld ext 15# 2 sets 10 Cable pulley AR press and circles 10 each 10# 8# lateral flexion 15 x each Supine feet on ball bridge, KTC and obl 15x Iso abdominals with ball 15x hold 3 sec STM with and without theragun to left LB KT tape left LB  01/12/23 UBE L 3 2 min fwd/2 min backward Cable pulley shld ext 10# 2 sets 10 Cable pulley AR press, circles and rotation 10 each direction each side 10# 8# lateral flexion 15x BIL Lat Pull Down and Seated Row 25 # 2 sets 10 Supine feet on ball bridge, KTC and obl 15x Iso abdominals with ball 15x hold 3 sec PROM LE and trunk - less tightness noted but still tight throughout and stressed need to stretch daily Still very isolated TP left side ( he felt DN was less painful then STW and KT tape helpd0 DN MAlbright PT KTape tape over area to help increase blood flow and increase circulation    PATIENT EDUCATION:  Education details: POC Person educated: Patient Education method: Explanation Education comprehension: verbalized understanding  HOME EXERCISE PROGRAM: [id number]  ASSESSMENT:  CLINICAL IMPRESSION: Pt said that  last session went well and limited his pain over the weekend. This session emphasized core and LE strengthening. Verbal and tactile cues needed for correct form during rows. Patient tolerated treatment well and is making progress toward goal 3 (perform daily activities with no back pain 1x week). Continue with strengthening his core and LEs to decrease his back pain and improve functional independence.  OBJECTIVE IMPAIRMENTS: decreased activity tolerance, decreased balance, decreased endurance, difficulty walking, decreased ROM, decreased strength, impaired flexibility, improper body mechanics, postural dysfunction, and pain.   ACTIVITY LIMITATIONS: carrying, lifting, bending, sitting, standing, squatting, and locomotion level  PARTICIPATION LIMITATIONS: meal prep, cleaning, driving, shopping, and community activity  PERSONAL FACTORS: Age and Past/current experiences are also affecting patient's functional outcome.   REHAB POTENTIAL: Good  CLINICAL DECISION MAKING: Evolving/moderate complexity  EVALUATION COMPLEXITY: Moderate   GOALS: Goals reviewed with patient? Yes  SHORT TERM GOALS: Target date: 12/23/22  I with initial HEP Baseline: Goal status: met 12/24/22  LONG TERM GOALS: Target  date: 03/05/23  I with final HEP Baseline:  Goal status: evolving 02/11/23  2.  Increase FOTO to at least 68 Baseline: 52 Goal status: Met 02/03/23 72  3.  Patient will be able to perform all his normal daily activities with no episode of back pain x 1 week. Baseline: up to 7/10 Goal status: 01/12/23 improving and progressing,  01/28/23 progressing, 02/03/23 Progressing 2-5/10  02/11/23 progressing  4.  Patient will walk on level and unlevel surfaces x at least 800' I with no C/O pain in back Baseline:  Goal status: 02/03/23 Met  5.  Up and down a flight of steps, step over step, with no pain or unsteadiness. Baseline:  Goal status: 02/03/23 Met  PLAN:  PT FREQUENCY: 1-2x/week  PT DURATION: 12  weeks  PLANNED INTERVENTIONS: Therapeutic exercises, Therapeutic activity, Neuromuscular re-education, Balance training, Gait training, Patient/Family education, Self Care, Joint mobilization, Stair training, Dry Needling, Electrical stimulation, Spinal manipulation, Cryotherapy, Moist heat, Ionotophoresis 4mg /ml Dexamethasone, and Manual therapy.  PLAN FOR NEXT SESSION: core stab,flexibility.  Patient Details  Name: [name] MRN: [id number] Date of Birth: 1951/05/07 Referring Provider:  Coletta Memos, MD  Encounter Date: 02/16/2023   George Ina SPTA 02/16/2023, 8:41 AM  [facility] [address] [place], [place], [address] Phone: [phone]   Fax:  [phone] [address]. [place], [place], [address] Phone: [phone]   Fax:  [phone] [address] [place], [place], [address] Phone: [phone]   Fax:  [phone]  Patient Details  Name: [name] MRN: [id number] Date of Birth: July 12, 1951 Referring Provider:  Coletta Memos, MD  Encounter Date: 02/16/2023   George Ina 02/16/2023, 8:41 AM  [facility] [address] [place], [place], [address] Phone: [phone]   Fax:  [phone]

## 2023-02-18 ENCOUNTER — Ambulatory Visit: Payer: Medicare Other | Admitting: Physical Therapy

## 2023-02-18 DIAGNOSIS — M6281 Muscle weakness (generalized): Secondary | ICD-10-CM

## 2023-02-18 DIAGNOSIS — M5459 Other low back pain: Secondary | ICD-10-CM

## 2023-02-18 DIAGNOSIS — R2681 Unsteadiness on feet: Secondary | ICD-10-CM | POA: Diagnosis not present

## 2023-02-18 DIAGNOSIS — R293 Abnormal posture: Secondary | ICD-10-CM | POA: Diagnosis not present

## 2023-02-18 NOTE — Therapy (Signed)
OUTPATIENT PHYSICAL THERAPY THORACOLUMBAR TREATMENT    Patient Name: [name] MRN: [id number] DOB:1950/08/28, 72 y.o., male Today's Date: 02/18/2023  END OF SESSION:  PT End of Session - 02/18/23 0846     Visit Number 15    Date for PT Re-Evaluation 03/05/23    Activity Tolerance Patient tolerated treatment well    Behavior During Therapy [facility] for tasks assessed/performed              Past Medical History:  Diagnosis Date   Hypertension    Past Surgical History:  Procedure Laterality Date   BACK SURGERY  2016   Dr. Nicanor Bake SURGERY  2020   Dr. Franky Macho   TOE SURGERY Left    has had pin placed and removed   TONSILLECTOMY     as a child   Patient Active Problem List   Diagnosis Date Noted   Lumbar adjacent segment disease with spondylolisthesis 05/15/2022   Lumbar stenosis with neurogenic claudication 06/15/2019   Spondylolisthesis of lumbar region 05/27/2013    PCP: Duane Lope  REFERRING PROVIDER: Coletta Memos, MD  REFERRING DIAG:  Diagnosis  [phone] (ICD-10-CM) - Spinal stenosis, lumbar region with neurogenic claudication    Rationale for Evaluation and Treatment: Rehabilitation  THERAPY DIAG:  Muscle weakness (generalized)  Other low back pain  ONSET DATE: 12/04/22  SUBJECTIVE:                                                                                                                                                                                           SUBJECTIVE STATEMENT: Still having a little pain. Felt sore after last treatment.  PERTINENT HISTORY:  05/16/22-L2-3 fusion, L2 laminectomy  PAIN:  Are you having pain? Yes: NPRS scale: 3/10 Pain location: low back Pain description: nagging tooth ache, catches him and throws him off balance Aggravating factors: No pattern, except when he sneezes or coughs Relieving factors: Can't identify anything  PRECAUTIONS: None  WEIGHT BEARING RESTRICTIONS: No  FALLS:  Has patient  fallen in last 6 months? 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  NEXT MD VISIT: about 12/27/22  OBJECTIVE:   DIAGNOSTIC FINDINGS:  CT 11/27/22 IMPRESSION: 1. Interval discectomy and fusion procedure at the L2-3 level. Hardware components appear well positioned. Considerable lucency surrounding the L2 pedicle  screws consistent with ongoing motion. Minimal subsidence of the interbody spacers. Apparent sufficient patency of the canal and foramina. 2. Previous posterior decompression, diskectomy and fusion procedure from L3 through L5 with solid union and sufficient patency of the canal and foramina. 3. L5-S1 chronic disc degeneration with vacuum phenomenon. Endplate osteophytes and bulging of the disc more prominent towards the right. Mild bilateral foraminal stenosis with some potential to affect the exiting L5 nerves. Stenosis of the subarticular lateral recesses, right more than left, that could compress either S1 nerve, particularly the right. 4. Aortic atherosclerosis. Maximal measured diameter 3.0 cm.  PATIENT SURVEYS:  FOTO 58.2  SCREENING FOR RED FLAGS: Bowel or bladder incontinence: No Spinal tumors: No Cauda equina syndrome: No Compression fracture: No Abdominal aneurysm: No  COGNITION: Overall cognitive status: Within functional limits for tasks assessed     SENSATION: WFL  MUSCLE LENGTH: Hamstrings: Right 68 deg; Left 49 deg Thomas test: WFL B  POSTURE: forward head, decreased lumbar lordosis, and decreased thoracic kyphosis  PALPATION: No real TTP, but lower back muscles very tight B.  LUMBAR ROM:   AROM eval 01/28/23  02/03/23  Flexion Mid shin P WFLS WFL  Extension 50% Limited 50% Limited 25%  Right lateral flexion 3" above knee P Limited 50% Limited 50%  Left lateral flexion 3" above knee P Limited 50% Limited 50%  Right rotation 60%  Limited 25%  Left rotation 90%  WFL   (Blank rows = not tested)  LOWER EXTREMITY ROM: B hip tightness in all planes.  LOWER EXTREMITY MMT:  B hips 4-/5, otherwise WNL  LUMBAR SPECIAL TESTS:  Straight leg raise test: Negative, Slump test: Negative, and FABER test: Positive  FUNCTIONAL TESTS:  5 times sit to stand: 10.2 WNL  GAIT: Distance walked: In clinic distances Assistive device utilized: None Level of assistance: Complete Independence Comments: Patient unsteady upon first standing in lobby, but otherwise no issues.  TODAY'S TREATMENT:                                                                                                                              DATE: 02/18/23 UBE L4 3 mins each way Lat pulldowns/rows 25# 2x15 Tricep ext 20# 2x10 Upright row into ext 20# 2x10 Sitting hor abd B 5lb weights 2x10 Sitting marching blue t-band 2x10 PROM LE  02/16/23 Trunk flex and ext black t-band sitting 2x10 Standing rows/ext/hor abd blue t-band 2x15 Standing marches opp hand to opp knee 2x10 Trunk lateral flexion standing with B 8lb weights 2x10 Trunk rotation standing holding blue weighted ball 2x10  OHP standing with blue weighted ball 2x10 Crunches supine on mat table x12 Crunches opp hand to opp knee x12 Bridges/obliques with red ball under legs 2x10 Single leg bridge x5 each Dead bug opp hand opp leg x10 PROM LE  02/11/23 Green tband 15 x shld ext,row,Horz abd and ER UBE standing 2 min each way L 3 20# upright row into trunk  ext 2 sets 10 STS wt ball 10 x chest press,10x OH press Seated bent over row, horz abd and shld ext 4# 2 sets 10 Iso abdominals upper and lower 10 each Feet on ball bridge 10 x hold 3 sec, SL bridge 5 x each Dead  bug Resisted clams and marching PROM LE       02/09/23 Black tband trunk flex and ext 20 x Lat Pull Down and Seated Row 25 # 2 sets 15 STS with wt ball press 2 sets 10 Wt ball trunk ext 10 x Upright row into trunk ext 5# each hand 10 x Seated bent over row, horz abd and shld ext 4# 2 sets 10 Quadreped UE 10 x, LE 10 x opp UE/LE 10 x Feet on ball bridge 15 x hold 3 sec, obl 20 , iso abdominals, KTC  02/05/23 UBE L 3 3 min fwd/3 min backward Lat Pull Down and Seated Row 25 # 2 sets 15 Black Tband trunk ext 20x Cable pulley AR press, circles and rotations 10 x each BIL 10# Lateral flexion 8# 15x each side STS with wt ball press 2 sets 10 Supine feet on ball bridge, KTC and obl. Isometric abd PROM LE and trunk      02/03/23 UBE L 3 2 min fwd/2 min backward Gait to back building, One flight of stairs 24 step alternating pattern with and without Rail Lat Pull Down and Seated Row 25 # 2 sets 12 Black Tband trunk ext 20x Seated  trunk rotations w/ yellow ball   01/28/23 Assess lumbar ROM flex WFLS ,ext and lateral flexion decreased 50% MMT grossly tested hips/knees 4/5 LE tightness but improving UBE L 3 3 min each way Lat Pull Down and Seated Row 25 # 2 sets 12 Black Tband trunk ext 20x Cable pulley shld ext 15# 2 sets 10 Cable pulley AR press and circles 10 each 10# 8# lateral flexion 15 x each Supine feet on ball bridge, KTC and obl 15x Iso abdominals with ball 15x hold 3 sec STM with and without theragun to left LB KT tape left LB  01/12/23 UBE L 3 2 min fwd/2 min backward Cable pulley shld ext 10# 2 sets 10 Cable pulley AR press, circles and rotation 10 each direction each side 10# 8# lateral flexion 15x BIL Lat Pull Down and Seated Row 25 # 2 sets 10 Supine feet on ball bridge, KTC and obl 15x Iso abdominals with ball 15x hold 3 sec PROM LE and trunk - less tightness noted but still tight throughout and stressed need to stretch daily Still very isolated TP left  side ( he felt DN was less painful then STW and KT tape helpd0 DN MAlbright PT KTape tape over area to help increase blood flow and increase circulation    PATIENT EDUCATION:  Education details: POC Person educated: Patient Education method: Explanation Education comprehension: verbalized understanding  HOME EXERCISE PROGRAM: [id number]  ASSESSMENT:  CLINICAL IMPRESSION: Patient was in a little bit of pain at the start of the session and pain continued with standing flexion movements, so exercises were modified (upright rows started at hip level rather than ankle to avoid flexion). He tolerated treatment well and treatment focused on UE and core strengthening. He is making progress toward goal 3 (perform activities with no back pain) and had some dizziness when sitting up after PROM. SPTA instructed him to pump his ankles and take his time sitting before standing.   OBJECTIVE IMPAIRMENTS: decreased activity tolerance,  decreased balance, decreased endurance, difficulty walking, decreased ROM, decreased strength, impaired flexibility, improper body mechanics, postural dysfunction, and pain.   ACTIVITY LIMITATIONS: carrying, lifting, bending, sitting, standing, squatting, and locomotion level  PARTICIPATION LIMITATIONS: meal prep, cleaning, driving, shopping, and community activity  PERSONAL FACTORS: Age and Past/current experiences are also affecting patient's functional outcome.   REHAB POTENTIAL: Good  CLINICAL DECISION MAKING: Evolving/moderate complexity  EVALUATION COMPLEXITY: Moderate   GOALS: Goals reviewed with patient? Yes  SHORT TERM GOALS: Target date: 12/23/22  I with initial HEP Baseline: Goal status: met 12/24/22  LONG TERM GOALS: Target date: 03/05/23  I with final HEP Baseline:  Goal status: evolving 02/11/23  2.  Increase FOTO to at least 68 Baseline: 52 Goal status: Met 02/03/23 72  3.  Patient will be able to perform all his normal daily activities with no  episode of back pain x 1 week. Baseline: up to 7/10 Goal status: 01/12/23 improving and progressing,  01/28/23 progressing, 02/03/23 Progressing 2-5/10  02/11/23 progressing  4.  Patient will walk on level and unlevel surfaces x at least 800' I with no C/O pain in back Baseline:  Goal status: 02/03/23 Met  5.  Up and down a flight of steps, step over step, with no pain or unsteadiness. Baseline:  Goal status: 02/03/23 Met  PLAN:  PT FREQUENCY: 1-2x/week  PT DURATION: 12 weeks  PLANNED INTERVENTIONS: Therapeutic exercises, Therapeutic activity, Neuromuscular re-education, Balance training, Gait training, Patient/Family education, Self Care, Joint mobilization, Stair training, Dry Needling, Electrical stimulation, Spinal manipulation, Cryotherapy, Moist heat, Ionotophoresis 4mg /ml Dexamethasone, and Manual therapy.  PLAN FOR NEXT SESSION: core stab, hip flexibility.    George Ina SPTA 02/18/2023, 8:46 AM Referring Provider:  Coletta Memos, MD

## 2023-02-23 ENCOUNTER — Ambulatory Visit: Payer: Medicare Other | Attending: Neurosurgery | Admitting: Physical Therapy

## 2023-02-23 ENCOUNTER — Encounter: Payer: Self-pay | Admitting: Physical Therapy

## 2023-02-23 DIAGNOSIS — M5459 Other low back pain: Secondary | ICD-10-CM | POA: Diagnosis not present

## 2023-02-23 DIAGNOSIS — R293 Abnormal posture: Secondary | ICD-10-CM | POA: Diagnosis not present

## 2023-02-23 DIAGNOSIS — R2681 Unsteadiness on feet: Secondary | ICD-10-CM | POA: Insufficient documentation

## 2023-02-23 DIAGNOSIS — M6281 Muscle weakness (generalized): Secondary | ICD-10-CM | POA: Diagnosis not present

## 2023-02-23 NOTE — Therapy (Signed)
OUTPATIENT PHYSICAL THERAPY THORACOLUMBAR TREATMENT    Patient Name: [name] MRN: [id number] DOB:August 18, 1951, 72 y.o., male Today's Date: 02/23/2023  END OF SESSION:  PT End of Session - 02/23/23 1145     Visit Number 16    Date for PT Re-Evaluation 03/05/23    PT Start Time 1145    PT Stop Time 1230    PT Time Calculation (min) 45 min    Activity Tolerance Patient tolerated treatment well    Behavior During Therapy [facility] for tasks assessed/performed              Past Medical History:  Diagnosis Date   Hypertension    Past Surgical History:  Procedure Laterality Date   BACK SURGERY  2016   Dr. Nicanor Bake SURGERY  2020   Dr. Franky Macho   TOE SURGERY Left    has had pin placed and removed   TONSILLECTOMY     as a child   Patient Active Problem List   Diagnosis Date Noted   Lumbar adjacent segment disease with spondylolisthesis 05/15/2022   Lumbar stenosis with neurogenic claudication 06/15/2019   Spondylolisthesis of lumbar region 05/27/2013    PCP: Duane Lope  REFERRING PROVIDER: Coletta Memos, MD  REFERRING DIAG:  Diagnosis  [phone] (ICD-10-CM) - Spinal stenosis, lumbar region with neurogenic claudication    Rationale for Evaluation and Treatment: Rehabilitation  THERAPY DIAG:  Muscle weakness (generalized)  Abnormal posture  Unsteadiness on feet  Other low back pain  ONSET DATE: 12/04/22  SUBJECTIVE:                                                                                                                                                                                           SUBJECTIVE STATEMENT: Doing ok, things are up and down. Think he is doing good at ties then pain returns. Can feel it when he's walking, not a strong pain that will make him stop walking.  PERTINENT HISTORY:  05/16/22-L2-3 fusion, L2 laminectomy  PAIN:  Are you having pain? Yes: NPRS scale: 1/10 Pain location: low back Pain description: nagging tooth ache,  catches him and throws him off balance Aggravating factors: No pattern, except when he sneezes or coughs Relieving factors: Can't identify anything  PRECAUTIONS: None  WEIGHT BEARING RESTRICTIONS: No  FALLS:  Has patient fallen in last 6 months? No  LIVING ENVIRONMENT: Lives with: lives with their family Lives in: House/apartment Stairs: Yes: Internal: 14 steps; on left going up and External: 3 steps; bilateral but cannot reach both Has following equipment at home: None  OCCUPATION: Retired, ride bike 37m/day, yard work  PLOF: Independent  PATIENT GOALS: Decrease pain to allow him to return to his normal routine.  NEXT MD VISIT: about 12/27/22  OBJECTIVE:   DIAGNOSTIC FINDINGS:  CT 11/27/22 IMPRESSION: 1. Interval discectomy and fusion procedure at the L2-3 level. Hardware components appear well positioned. Considerable lucency surrounding the L2 pedicle screws consistent with ongoing motion. Minimal subsidence of the interbody spacers. Apparent sufficient patency of the canal and foramina. 2. Previous posterior decompression, diskectomy and fusion procedure from L3 through L5 with solid union and sufficient patency of the canal and foramina. 3. L5-S1 chronic disc degeneration with vacuum phenomenon. Endplate osteophytes and bulging of the disc more prominent towards the right. Mild bilateral foraminal stenosis with some potential to affect the exiting L5 nerves. Stenosis of the subarticular lateral recesses, right more than left, that could compress either S1 nerve, particularly the right. 4. Aortic atherosclerosis. Maximal measured diameter 3.0 cm.  PATIENT SURVEYS:  FOTO 58.2  SCREENING FOR RED FLAGS: Bowel or bladder incontinence: No Spinal tumors: No Cauda equina syndrome: No Compression fracture: No Abdominal aneurysm: No  COGNITION: Overall cognitive status: Within functional limits for tasks assessed     SENSATION: WFL  MUSCLE LENGTH: Hamstrings:  Right 68 deg; Left 49 deg Thomas test: WFL B  POSTURE: forward head, decreased lumbar lordosis, and decreased thoracic kyphosis  PALPATION: No real TTP, but lower back muscles very tight B.  LUMBAR ROM:   AROM eval 01/28/23 02/03/23  Flexion Mid shin P WFLS WFL  Extension 50% Limited 50% Limited 25%  Right lateral flexion 3" above knee P Limited 50% Limited 50%  Left lateral flexion 3" above knee P Limited 50% Limited 50%  Right rotation 60%  Limited 25%  Left rotation 90%  WFL   (Blank rows = not tested)  LOWER EXTREMITY ROM: B hip tightness in all planes.  LOWER EXTREMITY MMT:  B hips 4-/5, otherwise WNL  LUMBAR SPECIAL TESTS:  Straight leg raise test: Negative, Slump test: Negative, and FABER test: Positive  FUNCTIONAL TESTS:  5 times sit to stand: 10.2 WNL  GAIT: Distance walked: In clinic distances Assistive device utilized: None Level of assistance: Complete Independence Comments: Patient unsteady upon first standing in lobby, but otherwise no issues.  TODAY'S TREATMENT:                                                                                                                              DATE: 02/23/23 UBE L4 3 mins each way Shoulder Ext 10lb 2x10 Hamstring curls 25lb 2x10 Leg Ext 10lb 2x10 S2S OHP yellow ball 2x10 Seated trunk rotations yellow ball   Horiz Abd green 2x10 LE on Pball bridges, oblq, K2C PROM to LE with end rage holds   02/18/23 UBE L4 3 mins each way Lat pulldowns/rows 25# 2x15 Tricep ext 20# 2x10 Upright row into ext 20# 2x10 Sitting hor abd B 5lb weights 2x10 Sitting marching blue t-band 2x10 PROM LE  02/16/23 Trunk flex  and ext black t-band sitting 2x10 Standing rows/ext/hor abd blue t-band 2x15 Standing marches opp hand to opp knee 2x10 Trunk lateral flexion standing with B 8lb weights 2x10 Trunk rotation standing holding blue weighted ball 2x10  OHP standing with blue weighted ball 2x10 Crunches supine on mat table  x12 Crunches opp hand to opp knee x12 Bridges/obliques with red ball under legs 2x10 Single leg bridge x5 each Dead bug opp hand opp leg x10 PROM LE  02/11/23 Green tband 15 x shld ext,row,Horz abd and ER UBE standing 2 min each way L 3 20# upright row into trunk ext 2 sets 10 STS wt ball 10 x chest press,10x OH press Seated bent over row, horz abd and shld ext 4# 2 sets 10 Iso abdominals upper and lower 10 each Feet on ball bridge 10 x hold 3 sec, SL bridge 5 x each Dead bug Resisted clams and marching PROM LE  PATIENT EDUCATION:  Education details: POC Person educated: Patient Education method: Explanation Education comprehension: verbalized understanding  HOME EXERCISE PROGRAM: [id number]  ASSESSMENT:  CLINICAL IMPRESSION: Pt enters doing well with little pain. Pt stated the pain comes and does but does not know what causes it. Continues with postural strengthening. Added some isolated LE interventions without issue. Tactile cues needed to maintain correct posture with shoulder extensions.Cues needed to slow down with horizontal abductions. Pt has very tight piriformis muscles.   OBJECTIVE IMPAIRMENTS: decreased activity tolerance, decreased balance, decreased endurance, difficulty walking, decreased ROM, decreased strength, impaired flexibility, improper body mechanics, postural dysfunction, and pain.   ACTIVITY LIMITATIONS: carrying, lifting, bending, sitting, standing, squatting, and locomotion level  PARTICIPATION LIMITATIONS: meal prep, cleaning, driving, shopping, and community activity  PERSONAL FACTORS: Age and Past/current experiences are also affecting patient's functional outcome.   REHAB POTENTIAL: Good  CLINICAL DECISION MAKING: Evolving/moderate complexity  EVALUATION COMPLEXITY: Moderate   GOALS: Goals reviewed with patient? Yes  SHORT TERM GOALS: Target date: 12/23/22  I with initial HEP Baseline: Goal status: met 12/24/22  LONG TERM GOALS: Target  date: 03/05/23  I with final HEP Baseline:  Goal status: evolving 02/11/23  2.  Increase FOTO to at least 68 Baseline: 52 Goal status: Met 02/03/23 72  3.  Patient will be able to perform all his normal daily activities with no episode of back pain x 1 week. Baseline: up to 7/10 Goal status: 01/12/23 improving and progressing,  01/28/23 progressing, 02/03/23 Progressing 2-5/10  02/11/23 progressing  4.  Patient will walk on level and unlevel surfaces x at least 800' I with no C/O pain in back Baseline:  Goal status: 02/03/23 Met  5.  Up and down a flight of steps, step over step, with no pain or unsteadiness. Baseline:  Goal status: 02/03/23 Met  PLAN:  PT FREQUENCY: 1-2x/week  PT DURATION: 12 weeks  PLANNED INTERVENTIONS: Therapeutic exercises, Therapeutic activity, Neuromuscular re-education, Balance training, Gait training, Patient/Family education, Self Care, Joint mobilization, Stair training, Dry Needling, Electrical stimulation, Spinal manipulation, Cryotherapy, Moist heat, Ionotophoresis 4mg /ml Dexamethasone, and Manual therapy.  PLAN FOR NEXT SESSION: core stab, hip flexibility.    Grayce Sessions, PTA SPTA 02/23/2023, 11:45 AM Referring Provider:  No ref. provider found

## 2023-03-02 ENCOUNTER — Ambulatory Visit: Payer: Medicare Other | Admitting: Physical Therapy

## 2023-03-02 DIAGNOSIS — M6281 Muscle weakness (generalized): Secondary | ICD-10-CM | POA: Diagnosis not present

## 2023-03-02 DIAGNOSIS — R293 Abnormal posture: Secondary | ICD-10-CM | POA: Diagnosis not present

## 2023-03-02 DIAGNOSIS — M5459 Other low back pain: Secondary | ICD-10-CM | POA: Diagnosis not present

## 2023-03-02 DIAGNOSIS — R2681 Unsteadiness on feet: Secondary | ICD-10-CM | POA: Diagnosis not present

## 2023-03-02 NOTE — Therapy (Signed)
OUTPATIENT PHYSICAL THERAPY THORACOLUMBAR TREATMENT    Patient Name: [name] MRN: [id number] DOB:07/27/1951, 72 y.o., male Today's Date: 03/02/2023  END OF SESSION:  PT End of Session - 03/02/23 0801     Visit Number 17    Date for PT Re-Evaluation 03/05/23    Activity Tolerance Patient tolerated treatment well    Behavior During Therapy [facility] for tasks assessed/performed              Past Medical History:  Diagnosis Date   Hypertension    Past Surgical History:  Procedure Laterality Date   BACK SURGERY  2016   Dr. Nicanor Bake SURGERY  2020   Dr. Franky Macho   TOE SURGERY Left    has had pin placed and removed   TONSILLECTOMY     as a child   Patient Active Problem List   Diagnosis Date Noted   Lumbar adjacent segment disease with spondylolisthesis 05/15/2022   Lumbar stenosis with neurogenic claudication 06/15/2019   Spondylolisthesis of lumbar region 05/27/2013    PCP: Duane Lope  REFERRING PROVIDER: Coletta Memos, MD  REFERRING DIAG:  Diagnosis  [phone] (ICD-10-CM) - Spinal stenosis, lumbar region with neurogenic claudication    Rationale for Evaluation and Treatment: Rehabilitation  THERAPY DIAG:  Muscle weakness (generalized)  Abnormal posture  Unsteadiness on feet  ONSET DATE: 12/04/22  SUBJECTIVE:                                                                                                                                                                                           SUBJECTIVE STATEMENT:  Doing okay. Last session (leg strengthening) helped.  PERTINENT HISTORY:  05/16/22-L2-3 fusion, L2 laminectomy  PAIN:  Are you having pain? Yes: NPRS scale: 0/10 Pain location: low back Pain description: nagging tooth ache, catches him and throws him off balance Aggravating factors: No pattern, except when he sneezes or coughs Relieving factors: Can't identify anything  PRECAUTIONS: None  WEIGHT BEARING RESTRICTIONS: No  FALLS:   Has patient fallen in last 6 months? 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  NEXT MD VISIT: about 12/27/22  OBJECTIVE:   DIAGNOSTIC FINDINGS:  CT 11/27/22 IMPRESSION: 1. Interval discectomy and fusion procedure at the L2-3 level. Hardware components appear well positioned. Considerable lucency surrounding the L2 pedicle  screws consistent with ongoing motion. Minimal subsidence of the interbody spacers. Apparent sufficient patency of the canal and foramina. 2. Previous posterior decompression, diskectomy and fusion procedure from L3 through L5 with solid union and sufficient patency of the canal and foramina. 3. L5-S1 chronic disc degeneration with vacuum phenomenon. Endplate osteophytes and bulging of the disc more prominent towards the right. Mild bilateral foraminal stenosis with some potential to affect the exiting L5 nerves. Stenosis of the subarticular lateral recesses, right more than left, that could compress either S1 nerve, particularly the right. 4. Aortic atherosclerosis. Maximal measured diameter 3.0 cm.  PATIENT SURVEYS:  FOTO 58.2  SCREENING FOR RED FLAGS: Bowel or bladder incontinence: No Spinal tumors: No Cauda equina syndrome: No Compression fracture: No Abdominal aneurysm: No  COGNITION: Overall cognitive status: Within functional limits for tasks assessed     SENSATION: WFL  MUSCLE LENGTH: Hamstrings: Right 68 deg; Left 49 deg Thomas test: WFL B  POSTURE: forward head, decreased lumbar lordosis, and decreased thoracic kyphosis  PALPATION: No real TTP, but lower back muscles very tight B.  LUMBAR ROM:   AROM  eval 01/28/23 02/03/23  Flexion Mid shin P WFLS WFL  Extension 50% Limited 50% Limited 25%  Right lateral flexion 3" above knee P Limited 50% Limited 50%  Left lateral flexion 3" above knee P Limited 50% Limited 50%  Right rotation 60%  Limited 25%  Left rotation 90%  WFL   (Blank rows = not tested)  LOWER EXTREMITY ROM: B hip tightness in all planes.  LOWER EXTREMITY MMT:  B hips 4-/5, otherwise WNL  LUMBAR SPECIAL TESTS:  Straight leg raise test: Negative, Slump test: Negative, and FABER test: Positive  FUNCTIONAL TESTS:  5 times sit to stand: 10.2 WNL  GAIT: Distance walked: In clinic distances Assistive device utilized: None Level of assistance: Complete Independence Comments: Patient unsteady upon first standing in lobby, but otherwise no issues.  TODAY'S TREATMENT:                                                                                                                              DATE:  03/02/23 UBE L2.4 3 mins each way Knee ext 10lb 2x10 HS curls 25lb 2x10 Leg press 40lb 2x10 Standing hip ext B 10lb x10; dropped weight due to heavy work on opp leg to 5lb x10 Bridges/K2C with orange ball 2x10 Single leg bridge B orange ball x10 PROM LE   02/23/23 UBE L4 3 mins each way Shoulder Ext 10lb 2x10 Hamstring curls 25lb 2x10 Leg Ext 10lb 2x10 S2S OHP yellow ball 2x10 Seated trunk rotations yellow ball   Horiz Abd green 2x10 LE on Pball bridges, oblq, K2C PROM to LE with end rage holds   02/18/23 UBE L4 3 mins each way Lat pulldowns/rows 25# 2x15 Tricep ext 20# 2x10 Upright row into ext 20# 2x10 Sitting hor abd B 5lb weights 2x10 Sitting marching blue t-band 2x10 PROM LE  02/16/23 Trunk  flex and ext black t-band sitting 2x10 Standing rows/ext/hor abd blue t-band 2x15 Standing marches opp hand to opp knee 2x10 Trunk lateral flexion standing with B 8lb weights 2x10 Trunk rotation standing holding blue weighted ball 2x10  OHP standing with blue weighted ball  2x10 Crunches supine on mat table x12 Crunches opp hand to opp knee x12 Bridges/obliques with red ball under legs 2x10 Single leg bridge x5 each Dead bug opp hand opp leg x10 PROM LE  02/11/23 Green tband 15 x shld ext,row,Horz abd and ER UBE standing 2 min each way L 3 20# upright row into trunk ext 2 sets 10 STS wt ball 10 x chest press,10x OH press Seated bent over row, horz abd and shld ext 4# 2 sets 10 Iso abdominals upper and lower 10 each Feet on ball bridge 10 x hold 3 sec, SL bridge 5 x each Dead bug Resisted clams and marching PROM LE  PATIENT EDUCATION:  Education details: POC Person educated: Patient Education method: Explanation Education comprehension: verbalized understanding  HOME EXERCISE PROGRAM: [id number]  ASSESSMENT:  CLINICAL IMPRESSION: Pt enters doing well with no pain before and after the session. He hadn't done LE strengthening before and he said it helped when he did last session so we continued with that today. Tactile cues were needed during standing hip ext to keep trunk upright and not lean forward. He reported feeling no pain after kickbacks which tend to cause him pain in his low back. He had tightness in B LE found by PROM. He tolerated treatment well and would benefit from continued PT to increase his strength and decrease his pain.  OBJECTIVE IMPAIRMENTS: decreased activity tolerance, decreased balance, decreased endurance, difficulty walking, decreased ROM, decreased strength, impaired flexibility, improper body mechanics, postural dysfunction, and pain.   ACTIVITY LIMITATIONS: carrying, lifting, bending, sitting, standing, squatting, and locomotion level  PARTICIPATION LIMITATIONS: meal prep, cleaning, driving, shopping, and community activity  PERSONAL FACTORS: Age and Past/current experiences are also affecting patient's functional outcome.   REHAB POTENTIAL: Good  CLINICAL DECISION MAKING: Evolving/moderate complexity  EVALUATION  COMPLEXITY: Moderate   GOALS: Goals reviewed with patient? Yes  SHORT TERM GOALS: Target date: 12/23/22  I with initial HEP Baseline: Goal status: met 12/24/22  LONG TERM GOALS: Target date: 03/05/23  I with final HEP Baseline:  Goal status: evolving 02/11/23  2.  Increase FOTO to at least 68 Baseline: 52 Goal status: Met 02/03/23 72  3.  Patient will be able to perform all his normal daily activities with no episode of back pain x 1 week. Baseline: up to 7/10 Goal status: 01/12/23 improving and progressing,  01/28/23 progressing, 02/03/23 Progressing 2-5/10  02/11/23 progressing  4.  Patient will walk on level and unlevel surfaces x at least 800' I with no C/O pain in back Baseline:  Goal status: 02/03/23 Met  5.  Up and down a flight of steps, step over step, with no pain or unsteadiness. Baseline:  Goal status: 02/03/23 Met  PLAN:  PT FREQUENCY: 1-2x/week  PT DURATION: 12 weeks  PLANNED INTERVENTIONS: Therapeutic exercises, Therapeutic activity, Neuromuscular re-education, Balance training, Gait training, Patient/Family education, Self Care, Joint mobilization, Stair training, Dry Needling, Electrical stimulation, Spinal manipulation, Cryotherapy, Moist heat, Ionotophoresis 4mg /ml Dexamethasone, and Manual therapy.  PLAN FOR NEXT SESSION: continue with LE strengthening   George Ina SPTA 03/02/2023, 8:04 AM

## 2023-03-04 ENCOUNTER — Ambulatory Visit: Payer: Medicare Other | Admitting: Physical Therapy

## 2023-03-04 DIAGNOSIS — M5459 Other low back pain: Secondary | ICD-10-CM | POA: Diagnosis not present

## 2023-03-04 DIAGNOSIS — M6281 Muscle weakness (generalized): Secondary | ICD-10-CM | POA: Diagnosis not present

## 2023-03-04 DIAGNOSIS — R293 Abnormal posture: Secondary | ICD-10-CM | POA: Diagnosis not present

## 2023-03-04 DIAGNOSIS — R2681 Unsteadiness on feet: Secondary | ICD-10-CM

## 2023-03-04 NOTE — Therapy (Signed)
OUTPATIENT PHYSICAL THERAPY THORACOLUMBAR TREATMENT    Patient Name: [name] MRN: [id number] DOB:1950-11-24, 72 y.o., male Today's Date: 03/04/2023  END OF SESSION:  PT End of Session - 03/04/23 0757     Visit Number 18    Date for PT Re-Evaluation 03/05/23    PT Start Time 0800    PT Stop Time 0845    PT Time Calculation (min) 45 min    Activity Tolerance Patient tolerated treatment well    Behavior During Therapy [facility] for tasks assessed/performed              Past Medical History:  Diagnosis Date   Hypertension    Past Surgical History:  Procedure Laterality Date   BACK SURGERY  2016   Dr. Nicanor Bake SURGERY  2020   Dr. Franky Macho   TOE SURGERY Left    has had pin placed and removed   TONSILLECTOMY     as a child   Patient Active Problem List   Diagnosis Date Noted   Lumbar adjacent segment disease with spondylolisthesis 05/15/2022   Lumbar stenosis with neurogenic claudication 06/15/2019   Spondylolisthesis of lumbar region 05/27/2013    PCP: Duane Lope  REFERRING PROVIDER: Coletta Memos, MD  REFERRING DIAG:  Diagnosis  [phone] (ICD-10-CM) - Spinal stenosis, lumbar region with neurogenic claudication    Rationale for Evaluation and Treatment: Rehabilitation  THERAPY DIAG:  Muscle weakness (generalized)  Abnormal posture  Unsteadiness on feet  Other low back pain  ONSET DATE: 12/04/22  SUBJECTIVE:                                                                                                                                                                                           SUBJECTIVE STATEMENT:  Doing good-think we found what he needed (the lower strengthening is helping)  PERTINENT HISTORY:  05/16/22-L2-3 fusion, L2 laminectomy  PAIN:  Are you having pain? Yes: NPRS scale: 0/10 Pain location: low back Pain description: nagging tooth ache, catches him and throws him off balance Aggravating factors: No pattern, except when  he sneezes or coughs Relieving factors: Can't identify anything  PRECAUTIONS: None  WEIGHT BEARING RESTRICTIONS: No  FALLS:  Has patient fallen in last 6 months? 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  NEXT MD VISIT:  about 12/27/22  OBJECTIVE:   DIAGNOSTIC FINDINGS:  CT 11/27/22 IMPRESSION: 1. Interval discectomy and fusion procedure at the L2-3 level. Hardware components appear well positioned. Considerable lucency surrounding the L2 pedicle screws consistent with ongoing motion. Minimal subsidence of the interbody spacers. Apparent sufficient patency of the canal and foramina. 2. Previous posterior decompression, diskectomy and fusion procedure from L3 through L5 with solid union and sufficient patency of the canal and foramina. 3. L5-S1 chronic disc degeneration with vacuum phenomenon. Endplate osteophytes and bulging of the disc more prominent towards the right. Mild bilateral foraminal stenosis with some potential to affect the exiting L5 nerves. Stenosis of the subarticular lateral recesses, right more than left, that could compress either S1 nerve, particularly the right. 4. Aortic atherosclerosis. Maximal measured diameter 3.0 cm.  PATIENT SURVEYS:  FOTO 58.2  SCREENING FOR RED FLAGS: Bowel or bladder incontinence: No Spinal tumors: No Cauda equina syndrome: No Compression fracture: No Abdominal aneurysm: No  COGNITION: Overall cognitive status: Within functional limits for tasks assessed     SENSATION: WFL  MUSCLE LENGTH: Hamstrings: Right 68 deg; Left 49 deg Thomas test: WFL B  POSTURE: forward head, decreased lumbar  lordosis, and decreased thoracic kyphosis  PALPATION: No real TTP, but lower back muscles very tight B.  LUMBAR ROM:   AROM eval 01/28/23 02/03/23  Flexion Mid shin P WFLS WFL  Extension 50% Limited 50% Limited 25%  Right lateral flexion 3" above knee P Limited 50% Limited 50%  Left lateral flexion 3" above knee P Limited 50% Limited 50%  Right rotation 60%  Limited 25%  Left rotation 90%  WFL   (Blank rows = not tested)  LOWER EXTREMITY ROM: B hip tightness in all planes.  LOWER EXTREMITY MMT:  B hips 4-/5, otherwise WNL  LUMBAR SPECIAL TESTS:  Straight leg raise test: Negative, Slump test: Negative, and FABER test: Positive  FUNCTIONAL TESTS:  5 times sit to stand: 10.2 WNL  GAIT: Distance walked: In clinic distances Assistive device utilized: None Level of assistance: Complete Independence Comments: Patient unsteady upon first standing in lobby, but otherwise no issues.  TODAY'S TREATMENT:                                                                                                                              DATE:  03/04/23 UBE L4 3 mins each way Leg press 40lb 3x10 Knee ext 10lb 3x10 HS curls 25lb 3x10 LE PROM in sitting  Squats with yellow ball into OHP x10; BW squats x10 due to pain in back with overhead motion Lunges with x10 Standing marches 1lb weights 2x10 Bridges/K2C/obliques with orange ball 2x10 PROM LE    03/02/23 UBE L2.4 3 mins each way Knee ext 10lb 2x10 HS curls 25lb 2x10 Leg press 40lb 2x10 Standing hip ext B 10lb x10; dropped weight due to heavy work on opp leg to 5lb x10 Bridges/K2C with orange ball 2x10 Single leg bridge B orange ball  x10 PROM LE   02/23/23 UBE L4 3 mins each way Shoulder Ext 10lb 2x10 Hamstring curls 25lb 2x10 Leg Ext 10lb 2x10 S2S OHP yellow ball 2x10 Seated trunk rotations yellow ball   Horiz Abd green 2x10 LE on Pball bridges, oblq, K2C PROM to LE with end rage holds   02/18/23 UBE L4 3 mins each way Lat  pulldowns/rows 25# 2x15 Tricep ext 20# 2x10 Upright row into ext 20# 2x10 Sitting hor abd B 5lb weights 2x10 Sitting marching blue t-band 2x10 PROM LE  02/16/23 Trunk flex and ext black t-band sitting 2x10 Standing rows/ext/hor abd blue t-band 2x15 Standing marches opp hand to opp knee 2x10 Trunk lateral flexion standing with B 8lb weights 2x10 Trunk rotation standing holding blue weighted ball 2x10  OHP standing with blue weighted ball 2x10 Crunches supine on mat table x12 Crunches opp hand to opp knee x12 Bridges/obliques with red ball under legs 2x10 Single leg bridge x5 each Dead bug opp hand opp leg x10 PROM LE  02/11/23 Green tband 15 x shld ext,row,Horz abd and ER UBE standing 2 min each way L 3 20# upright row into trunk ext 2 sets 10 STS wt ball 10 x chest press,10x OH press Seated bent over row, horz abd and shld ext 4# 2 sets 10 Iso abdominals upper and lower 10 each Feet on ball bridge 10 x hold 3 sec, SL bridge 5 x each Dead bug Resisted clams and marching PROM LE  PATIENT EDUCATION:  Education details: POC Person educated: Patient Education method: Explanation Education comprehension: verbalized understanding  HOME EXERCISE PROGRAM: [id number]  ASSESSMENT:  CLINICAL IMPRESSION: Pt came in feeling good with no pain. He feels the leg strengthening has been the most helpful and has given him no pain. He feels confident in his knowledge and ability to continue with daily activities without risk of injury. He had some pain in his low back when doing OHP that went away. He has met all his goals and is ready to be discharged.    OBJECTIVE IMPAIRMENTS: decreased activity tolerance, decreased balance, decreased endurance, difficulty walking, decreased ROM, decreased strength, impaired flexibility, improper body mechanics, postural dysfunction, and pain.   ACTIVITY LIMITATIONS: carrying, lifting, bending, sitting, standing, squatting, and locomotion  level  PARTICIPATION LIMITATIONS: meal prep, cleaning, driving, shopping, and community activity  PERSONAL FACTORS: Age and Past/current experiences are also affecting patient's functional outcome.   REHAB POTENTIAL: Good  CLINICAL DECISION MAKING: Evolving/moderate complexity  EVALUATION COMPLEXITY: Moderate   GOALS: Goals reviewed with patient? Yes  SHORT TERM GOALS: Target date: 12/23/22  I with initial HEP Baseline: Goal status: met 12/24/22  LONG TERM GOALS: Target date: 03/05/23  I with final HEP Baseline:  Goal status: evolving 02/11/23; 03/04/23 MET  2.  Increase FOTO to at least 68 Baseline: 52 Goal status: Met 02/03/23 72  3.  Patient will be able to perform all his normal daily activities with no episode of back pain x 1 week. Baseline: up to 7/10 Goal status: 01/12/23 improving and progressing,  01/28/23 progressing, 02/03/23 Progressing 2-5/10  02/11/23 progressing; 03/04/23 MET  4.  Patient will walk on level and unlevel surfaces x at least 800' I with no C/O pain in back Baseline:  Goal status: 02/03/23 Met  5.  Up and down a flight of steps, step over step, with no pain or unsteadiness. Baseline:  Goal status: 02/03/23 Met  PLAN:  PT FREQUENCY: 1-2x/week  PT DURATION: 12 weeks  PLANNED INTERVENTIONS: Therapeutic exercises,  Therapeutic activity, Neuromuscular re-education, Balance training, Gait training, Patient/Family education, Self Care, Joint mobilization, Stair training, Dry Needling, Electrical stimulation, Spinal manipulation, Cryotherapy, Moist heat, Ionotophoresis 4mg /ml Dexamethasone, and Manual therapy.  PLAN FOR NEXT SESSION:  PHYSICAL THERAPY DISCHARGE SUMMARY   Patient agrees to discharge. Patient goals were met. Patient is being discharged due to meeting the stated rehab goals.    George Ina SPTA 03/04/2023, 7:58 AM

## 2024-02-17 DIAGNOSIS — E7800 Pure hypercholesterolemia, unspecified: Secondary | ICD-10-CM | POA: Diagnosis not present

## 2024-02-17 DIAGNOSIS — I10 Essential (primary) hypertension: Secondary | ICD-10-CM | POA: Diagnosis not present

## 2024-02-23 DIAGNOSIS — I10 Essential (primary) hypertension: Secondary | ICD-10-CM | POA: Diagnosis not present

## 2024-02-23 DIAGNOSIS — J449 Chronic obstructive pulmonary disease, unspecified: Secondary | ICD-10-CM | POA: Diagnosis not present

## 2024-02-23 DIAGNOSIS — Z0000 Encounter for general adult medical examination without abnormal findings: Secondary | ICD-10-CM | POA: Diagnosis not present

## 2024-02-23 DIAGNOSIS — E7800 Pure hypercholesterolemia, unspecified: Secondary | ICD-10-CM | POA: Diagnosis not present

## 2024-02-23 DIAGNOSIS — R454 Irritability and anger: Secondary | ICD-10-CM | POA: Diagnosis not present

## 2024-03-01 DIAGNOSIS — B079 Viral wart, unspecified: Secondary | ICD-10-CM | POA: Diagnosis not present

## 2024-05-22 DIAGNOSIS — R7301 Impaired fasting glucose: Secondary | ICD-10-CM | POA: Diagnosis not present

## 2024-05-22 DIAGNOSIS — E7800 Pure hypercholesterolemia, unspecified: Secondary | ICD-10-CM | POA: Diagnosis not present

## 2024-05-22 DIAGNOSIS — Z79899 Other long term (current) drug therapy: Secondary | ICD-10-CM | POA: Diagnosis not present

## 2024-06-14 DIAGNOSIS — E7800 Pure hypercholesterolemia, unspecified: Secondary | ICD-10-CM | POA: Diagnosis not present

## 2024-06-14 DIAGNOSIS — I10 Essential (primary) hypertension: Secondary | ICD-10-CM | POA: Diagnosis not present
